# Patient Record
Sex: Female | Born: 1970 | Race: Black or African American | Hispanic: Yes | Smoking: Current every day smoker
Health system: Southern US, Community
[De-identification: ages and names within clinical notes are randomized; demographics above are authoritative.]

## PROBLEM LIST (undated history)

## (undated) DIAGNOSIS — C801 Malignant (primary) neoplasm, unspecified: Secondary | ICD-10-CM

## (undated) DIAGNOSIS — G629 Polyneuropathy, unspecified: Secondary | ICD-10-CM

## (undated) HISTORY — PX: CHOLECYSTECTOMY: SHX55

## (undated) HISTORY — PX: ABDOMINAL HYSTERECTOMY: SHX81

## (undated) HISTORY — PX: OTHER SURGICAL HISTORY: SHX169

## (undated) HISTORY — PX: BRAIN SURGERY: SHX531

---

## 2006-03-22 ENCOUNTER — Emergency Department: Payer: Self-pay | Admitting: Emergency Medicine

## 2007-04-18 ENCOUNTER — Ambulatory Visit: Payer: Self-pay

## 2007-05-09 ENCOUNTER — Encounter: Payer: Self-pay | Admitting: Orthopaedic Surgery

## 2007-05-16 ENCOUNTER — Encounter: Payer: Self-pay | Admitting: Orthopaedic Surgery

## 2007-06-16 ENCOUNTER — Encounter: Payer: Self-pay | Admitting: Orthopaedic Surgery

## 2007-07-20 ENCOUNTER — Ambulatory Visit: Payer: Self-pay | Admitting: Emergency Medicine

## 2007-12-18 ENCOUNTER — Ambulatory Visit: Payer: Self-pay | Admitting: Internal Medicine

## 2008-08-17 ENCOUNTER — Ambulatory Visit: Payer: Self-pay | Admitting: Internal Medicine

## 2008-12-20 ENCOUNTER — Encounter: Payer: Self-pay | Admitting: Orthopedic Surgery

## 2009-01-13 ENCOUNTER — Encounter: Payer: Self-pay | Admitting: Orthopedic Surgery

## 2009-02-13 ENCOUNTER — Encounter: Payer: Self-pay | Admitting: Orthopedic Surgery

## 2010-01-17 ENCOUNTER — Ambulatory Visit: Payer: Self-pay | Admitting: Internal Medicine

## 2010-04-21 ENCOUNTER — Ambulatory Visit: Payer: Self-pay | Admitting: Internal Medicine

## 2011-02-27 ENCOUNTER — Ambulatory Visit: Payer: Self-pay | Admitting: Family Medicine

## 2013-05-16 ENCOUNTER — Ambulatory Visit: Payer: Self-pay | Admitting: Specialist

## 2013-05-31 ENCOUNTER — Ambulatory Visit: Payer: Self-pay | Admitting: Specialist

## 2013-06-21 LAB — PATHOLOGY REPORT

## 2014-01-02 ENCOUNTER — Emergency Department: Payer: Self-pay | Admitting: Emergency Medicine

## 2014-01-02 LAB — COMPREHENSIVE METABOLIC PANEL
AST: 26 U/L (ref 15–37)
Albumin: 3.2 g/dL — ABNORMAL LOW (ref 3.4–5.0)
Alkaline Phosphatase: 104 U/L
Anion Gap: 5 — ABNORMAL LOW (ref 7–16)
BILIRUBIN TOTAL: 0.4 mg/dL (ref 0.2–1.0)
BUN: 10 mg/dL (ref 7–18)
CHLORIDE: 102 mmol/L (ref 98–107)
CO2: 28 mmol/L (ref 21–32)
Calcium, Total: 8.8 mg/dL (ref 8.5–10.1)
Creatinine: 0.91 mg/dL (ref 0.60–1.30)
EGFR (African American): >60
EGFR (Non-African Amer.): >60
GLUCOSE: 86 mg/dL (ref 65–99)
OSMOLALITY: 268 (ref 275–301)
Potassium: 3.9 mmol/L (ref 3.5–5.1)
SGPT (ALT): 27 U/L (ref 12–78)
Sodium: 135 mmol/L — ABNORMAL LOW (ref 136–145)
Total Protein: 7.2 g/dL (ref 6.4–8.2)

## 2014-01-02 LAB — CBC
HCT: 37.1 % (ref 35.0–47.0)
HGB: 12.6 g/dL (ref 12.0–16.0)
MCH: 34 pg (ref 26.0–34.0)
MCHC: 34 g/dL (ref 32.0–36.0)
MCV: 100 fL (ref 80–100)
Platelet: 96 10*3/uL — ABNORMAL LOW (ref 150–440)
RBC: 3.71 10*6/uL — ABNORMAL LOW (ref 3.80–5.20)
RDW: 14.5 % (ref 11.5–14.5)
WBC: 4.4 10*3/uL (ref 3.6–11.0)

## 2014-01-02 LAB — MAGNESIUM: Magnesium: 1.9 mg/dL

## 2015-04-17 DIAGNOSIS — C7800 Secondary malignant neoplasm of unspecified lung: Secondary | ICD-10-CM | POA: Insufficient documentation

## 2016-03-24 ENCOUNTER — Other Ambulatory Visit: Payer: Self-pay | Admitting: Family Medicine

## 2016-03-24 DIAGNOSIS — R29898 Other symptoms and signs involving the musculoskeletal system: Secondary | ICD-10-CM

## 2016-04-07 ENCOUNTER — Ambulatory Visit: Payer: Medicare Other

## 2016-04-08 ENCOUNTER — Ambulatory Visit: Payer: Medicare Other

## 2016-10-06 ENCOUNTER — Other Ambulatory Visit: Payer: Self-pay | Admitting: Orthopedic Surgery

## 2016-10-06 DIAGNOSIS — M25559 Pain in unspecified hip: Principal | ICD-10-CM

## 2016-10-06 DIAGNOSIS — G8929 Other chronic pain: Secondary | ICD-10-CM

## 2016-10-06 DIAGNOSIS — C3490 Malignant neoplasm of unspecified part of unspecified bronchus or lung: Secondary | ICD-10-CM

## 2016-10-14 ENCOUNTER — Encounter: Payer: Self-pay | Admitting: Radiology

## 2016-10-14 ENCOUNTER — Ambulatory Visit
Admission: RE | Admit: 2016-10-14 | Discharge: 2016-10-14 | Disposition: A | Discharge status: Home / Self Care | Payer: Medicare Other | Source: Ambulatory Visit | Attending: Orthopedic Surgery | Admitting: Orthopedic Surgery

## 2016-10-14 DIAGNOSIS — M5137 Other intervertebral disc degeneration, lumbosacral region: Secondary | ICD-10-CM | POA: Diagnosis not present

## 2016-10-14 DIAGNOSIS — G8929 Other chronic pain: Secondary | ICD-10-CM

## 2016-10-14 DIAGNOSIS — M25559 Pain in unspecified hip: Secondary | ICD-10-CM | POA: Diagnosis present

## 2016-10-14 DIAGNOSIS — C3490 Malignant neoplasm of unspecified part of unspecified bronchus or lung: Secondary | ICD-10-CM

## 2017-01-08 ENCOUNTER — Emergency Department
Admission: EM | Admit: 2017-01-08 | Discharge: 2017-01-09 | Disposition: A | Discharge status: Home / Self Care | Payer: Medicare Other | Attending: Emergency Medicine | Admitting: Emergency Medicine

## 2017-01-08 ENCOUNTER — Encounter: Payer: Self-pay | Admitting: Emergency Medicine

## 2017-01-08 DIAGNOSIS — F17290 Nicotine dependence, other tobacco product, uncomplicated: Secondary | ICD-10-CM | POA: Diagnosis not present

## 2017-01-08 DIAGNOSIS — G6289 Other specified polyneuropathies: Secondary | ICD-10-CM | POA: Insufficient documentation

## 2017-01-08 DIAGNOSIS — G5773 Causalgia of bilateral lower limbs: Secondary | ICD-10-CM | POA: Diagnosis present

## 2017-01-08 DIAGNOSIS — R609 Edema, unspecified: Secondary | ICD-10-CM | POA: Insufficient documentation

## 2017-01-08 DIAGNOSIS — Z95828 Presence of other vascular implants and grafts: Secondary | ICD-10-CM | POA: Insufficient documentation

## 2017-01-08 DIAGNOSIS — Z79891 Long term (current) use of opiate analgesic: Secondary | ICD-10-CM | POA: Insufficient documentation

## 2017-01-08 DIAGNOSIS — Z79899 Other long term (current) drug therapy: Secondary | ICD-10-CM | POA: Diagnosis not present

## 2017-01-08 HISTORY — DX: Malignant (primary) neoplasm, unspecified: C80.1

## 2017-01-08 HISTORY — DX: Polyneuropathy, unspecified: G62.9

## 2017-01-08 MED ORDER — OXYCODONE HCL 5 MG PO TABS
5.0000 mg | ORAL_TABLET | Freq: Once | ORAL | Status: AC
  Administered 2017-01-09: 5 mg via ORAL
  Filled 2017-01-08: qty 1

## 2017-01-08 NOTE — ED Triage Notes (Signed)
"  I can't manage my pain in my feet and lower legs".  Patient states she went to [facility] one day last week and hasn't been able to get pain under control since.    Patient takes Oxycodone 5mg  TID and Flexeril TID.  Today patient has taken oxycodone 10 mg at 1400, Flexeril 10 mg at 1400, and at 1800 patient took a 15 mg oral morphine extended release tablet (from her mother).

## 2017-01-08 NOTE — ED Notes (Signed)
Patient has a Right Chest port-a-cath. Does not want blood to be drawn peripherally.  Bloods not drawn in triage.

## 2017-01-09 DIAGNOSIS — G6289 Other specified polyneuropathies: Secondary | ICD-10-CM | POA: Diagnosis not present

## 2017-01-09 MED ORDER — HYDROMORPHONE HCL 1 MG/ML IJ SOLN
1.0000 mg | Freq: Once | INTRAMUSCULAR | Status: AC
  Administered 2017-01-09: 1 mg via INTRAMUSCULAR
  Filled 2017-01-09: qty 1

## 2017-01-09 NOTE — ED Provider Notes (Signed)
[facility] Emergency Department Provider Note  ____________________________________________   First MD Initiated Contact with Patient 01/08/17 2359     (approximate)  I have reviewed the triage vital signs and the nursing notes.   HISTORY  Chief Complaint Leg Pain   HPI Bonnie Adams is a 46 y.o. 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 The patient says that with changes of weather that she usually has exacerbations of her pain. She thinks that the increased humidity this week is setting off her symptoms. 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 She usually takes oxycodone at home which she took tonight. Also takes Lyrica and took somebody else's morphine but says that only made her little bit sleepy. She is still having 7 out of 10 pain. Does not report any shortness of breath or chest pain.Says that she also feels a spasm-like pain to her bilateral thighs intermittently. Past Medical History:  Diagnosis Date  . Cancer ([place])    head and neck cancer with mets to lungs  . Neuropathy     There are no active problems to display for this patient.   Past Surgical History:  Procedure Laterality Date  . ABDOMINAL HYSTERECTOMY    . BRAIN SURGERY     breast biopsy  . CHOLECYSTECTOMY    . lobectomy Right    right lower lobe lung    Prior to Admission medications   Medication Sig Start Date End Date Taking? Authorizing Provider  cyclobenzaprine (FLEXERIL) 10 MG tablet Take 10 mg by mouth 3 (three) times daily. 10/26/16  Yes [provider]  oxyCODONE (OXY IR/ROXICODONE) 5 MG immediate release tablet Take 5 mg by mouth 3 (three) times daily.   Yes [provider]     Allergies Patient has no known allergies.  No family history on file.  Social History Social History  Substance Use Topics  . Smoking status: Current Every Day Smoker    Types: E-cigarettes  . Smokeless tobacco: Never Used  . Alcohol use No    Review of Systems  Constitutional: No fever/chills Eyes: No visual changes. ENT: No sore throat. Cardiovascular: Denies chest pain. Respiratory: Denies shortness of breath. Gastrointestinal: No abdominal pain.  No nausea, no vomiting.  No diarrhea.  No constipation. Genitourinary: Negative for dysuria. Musculoskeletal: as above Skin: Negative for rash. Neurological: Negative for headaches, focal weakness or numbness.   ____________________________________________   PHYSICAL EXAM:  VITAL SIGNS: ED Triage Vitals [01/08/17 2002]  Enc Vitals Group     BP      Pulse      Resp      Temp      Temp src      SpO2      Weight 220 lb (99.8 kg)     Height 5\' 10"  (1.778 m)     Head Circumference      Peak Flow      Pain Score 9     Pain Loc      Pain Edu?      Excl. in [place]?     Constitutional: Alert and oriented. Well appearing and in no acute distress. Eyes: Conjunctivae are normal.  Head: Atraumatic. Nose: No congestion/rhinnorhea. Mouth/Throat: Mucous membranes are moist.  Neck:  No stridor.   Cardiovascular: Normal rate, regular rhythm. Grossly normal heart sounds.  Good peripheral circulation With equal bilateral radial as well as dorsalis pedis pulses. Respiratory: Normal respiratory effort.  No retractions. Lungs CTAB. Gastrointestinal: Soft and nontender. No distention. No CVA tenderness. Musculoskeletal: Trace bilateral lower extremity as well as hand edema without any erythema or focal swelling.  No joint effusions. Sensation is intact to light touch with only minimal tenderness to palpation to the bilateral lower extremity. Neurologic:  Normal speech and language. No gross focal neurologic deficits are  appreciated. Skin:  Skin is warm, dry and intact. No rash noted. Psychiatric: Mood and affect are normal. Speech and behavior are normal.  ____________________________________________   LABS (all labs ordered are listed, but only abnormal results are displayed)  Labs Reviewed  CBC WITH DIFFERENTIAL/PLATELET  COMPREHENSIVE METABOLIC PANEL  PROTIME-INR  APTT   ____________________________________________  EKG   ____________________________________________  RADIOLOGY   ____________________________________________   PROCEDURES  Procedure(s) performed:   Procedures  Critical Care performed:   ____________________________________________   INITIAL IMPRESSION / ASSESSMENT AND PLAN / ED COURSE  Pertinent labs & imaging results that were available during my care of the patient were reviewed by me and considered in my medical decision making (see chart for details).  Patient's labs reviewed from this past June on the record with reassuring blood counts as well as electrolytes and kidney function. No focal swelling to leaving believe that the patient has a DVT. Patient gives the history that the symptoms are consistent with exacerbation of her chronic symptoms. We will treat with pain control. Patient is a difficult IV stick and we would otherwise have to access her port. I feel that the risks outweigh the benefits at this time. However, if her pain is not well controlled and we may need to access her port for further workup and pain control.    ----------------------------------------- 1:30 AM on 01/09/2017 -----------------------------------------  Patient says that her pain is now to a 3 out of 10 which is her baseline. She says that she still having intermittent spasms but this is likely related to the coldness of the emergency department. She says this is his normal trigger for her spasm. Patient with likely exacerbation of chronic pain. Patient appears calm and without  any distress. We discussed further medication the patient says that she thinks she will be fine with her at home medication at this time. Will be following up with her primary care doctor. We'll be discharged home.  ____________________________________________   FINAL CLINICAL IMPRESSION(S) / ED DIAGNOSES  Peripheral edema. Exacerbation of chronic pain. Neuropathy.    NEW MEDICATIONS STARTED DURING THIS VISIT:  New Prescriptions   No medications on file     Note:  This document was prepared using Dragon voice recognition software and may include unintentional dictation errors.     Orbie Pyo, MD 01/09/17 [phone]

## 2017-01-09 NOTE — ED Notes (Signed)
Pt. Going home with friend 

## 2017-01-09 NOTE — ED Notes (Signed)
Pt. 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  Pt. States last CA treatment was 2016.  Pt. States pain medication that she takes did not decrease pain today.  Pt. States she was given 15 mg morphine tablet today by family member and that did not relieve pain in lower extremities.  Pt. Has both lower extremities wrapped in ace wraps.

## 2017-05-03 ENCOUNTER — Encounter: Payer: Self-pay | Admitting: Emergency Medicine

## 2017-05-03 ENCOUNTER — Emergency Department
Admission: EM | Admit: 2017-05-03 | Discharge: 2017-05-03 | Disposition: A | Discharge status: Home / Self Care | Payer: Medicare Other | Attending: Emergency Medicine | Admitting: Emergency Medicine

## 2017-05-03 ENCOUNTER — Emergency Department: Payer: Medicare Other

## 2017-05-03 DIAGNOSIS — Z79899 Other long term (current) drug therapy: Secondary | ICD-10-CM | POA: Diagnosis not present

## 2017-05-03 DIAGNOSIS — F17210 Nicotine dependence, cigarettes, uncomplicated: Secondary | ICD-10-CM | POA: Diagnosis not present

## 2017-05-03 DIAGNOSIS — R079 Chest pain, unspecified: Secondary | ICD-10-CM | POA: Insufficient documentation

## 2017-05-03 DIAGNOSIS — R0600 Dyspnea, unspecified: Secondary | ICD-10-CM | POA: Diagnosis not present

## 2017-05-03 DIAGNOSIS — Z86711 Personal history of pulmonary embolism: Secondary | ICD-10-CM | POA: Insufficient documentation

## 2017-05-03 DIAGNOSIS — Z859 Personal history of malignant neoplasm, unspecified: Secondary | ICD-10-CM | POA: Insufficient documentation

## 2017-05-03 DIAGNOSIS — Z902 Acquired absence of lung [part of]: Secondary | ICD-10-CM | POA: Insufficient documentation

## 2017-05-03 LAB — CBC
HEMATOCRIT: 40.5 % (ref 35.0–47.0)
HEMOGLOBIN: 14.2 g/dL (ref 12.0–16.0)
MCH: 32.3 pg (ref 26.0–34.0)
MCHC: 35.1 g/dL (ref 32.0–36.0)
MCV: 91.8 fL (ref 80.0–100.0)
Platelets: 217 10*3/uL (ref 150–440)
RBC: 4.41 MIL/uL (ref 3.80–5.20)
RDW: 12.6 % (ref 11.5–14.5)
WBC: 5.4 10*3/uL (ref 3.6–11.0)

## 2017-05-03 LAB — BASIC METABOLIC PANEL
Anion gap: 9 (ref 5–15)
BUN: 10 mg/dL (ref 6–20)
CHLORIDE: 105 mmol/L (ref 101–111)
CO2: 24 mmol/L (ref 22–32)
Calcium: 9.9 mg/dL (ref 8.9–10.3)
Creatinine, Ser: 1.44 mg/dL — ABNORMAL HIGH (ref 0.44–1.00)
GFR calc Af Amer: 49 mL/min — ABNORMAL LOW (ref >60)
GFR, EST NON AFRICAN AMERICAN: 42 mL/min — AB (ref >60)
GLUCOSE: 120 mg/dL — AB (ref 65–99)
Potassium: 3.6 mmol/L (ref 3.5–5.1)
SODIUM: 138 mmol/L (ref 135–145)

## 2017-05-03 LAB — TROPONIN I: Troponin I: <0.03 ng/mL (ref <0.03)

## 2017-05-03 MED ORDER — IOPAMIDOL (ISOVUE-370) INJECTION 76%
75.0000 mL | Freq: Once | INTRAVENOUS | Status: AC | PRN
  Administered 2017-05-03: 75 mL via INTRAVENOUS
  Filled 2017-05-03: qty 75

## 2017-05-03 MED ORDER — HEPARIN SOD (PORK) LOCK FLUSH 100 UNIT/ML IV SOLN
500.0000 [IU] | Freq: Once | INTRAVENOUS | Status: AC
  Administered 2017-05-03: 500 [IU] via INTRAVENOUS
  Filled 2017-05-03 (×2): qty 5

## 2017-05-03 NOTE — ED Provider Notes (Signed)
[facility] Emergency Department Provider Note  Time seen: 5:14 PM  I have reviewed the triage vital signs and the nursing notes.   HISTORY  Chief Complaint Chest Pain    HPI Bonnie Adams is a 46 y.o. female with a complicated medical history including metastatic lung cancer currently on holistic treatments, neuropathy, pulmonary embolism on Eliquis, presents to the emergency department for chest discomfort.  According to the patient for the past 5 days she has been experiencing a tightness sensation in her chest that worsens with exertion.  Patient states mild shortness of breath with exertion as well.  Denies any pleuritic chest pain.  Denies any leg pain or increase in swelling.  Patient does state a history of a pulmonary embolism for which she currently takes Eliquis.     Past Medical History:  Diagnosis Date  . Cancer ([place])    head and neck cancer with mets to lungs  . Neuropathy     There are no active problems to display for this patient.   Past Surgical History:  Procedure Laterality Date  . ABDOMINAL HYSTERECTOMY    . BRAIN SURGERY     breast biopsy  . CHOLECYSTECTOMY    . lobectomy Right    right lower lobe lung    Prior to Admission medications   Medication Sig Start Date End Date Taking? Authorizing Provider  cyclobenzaprine (FLEXERIL) 10 MG tablet Take 10 mg by mouth 3 (three) times daily. 10/26/16   [provider]  oxyCODONE (OXY IR/ROXICODONE) 5 MG immediate release tablet Take 5 mg by mouth 3 (three) times daily.    [provider]    No Known Allergies  No family history on file.  Social History Social History   Tobacco Use  . Smoking status: Current Every Day Smoker    Types: E-cigarettes  . Smokeless tobacco: Never Used  Substance Use Topics  . Alcohol use: No  . Drug use: No    Review of Systems Constitutional: Negative for fever. Cardiovascular: Positive for chest tightness times 5 days,  constant worse with exertion. Respiratory: Mild shortness of breath, worse with exertion Gastrointestinal: Negative for abdominal pain Musculoskeletal: Negative for leg pain or increased swelling All other ROS negative  ____________________________________________   PHYSICAL EXAM:  VITAL SIGNS: ED Triage Vitals  Enc Vitals Group     BP 05/03/17 1547 (!) 118/95     Pulse Rate 05/03/17 1547 95     Resp 05/03/17 1547 16     Temp 05/03/17 1547 98.6 F (37 C)     Temp Source 05/03/17 1547 Oral     SpO2 05/03/17 1547 97 %     Weight 05/03/17 1543 250 lb (113.4 kg)     Height 05/03/17 1543 5\' 10"  (1.778 m)     Head Circumference --      Peak Flow --      Pain Score 05/03/17 1543 8     Pain Loc --      Pain Edu? --      Excl. in [place]? --    Constitutional: Alert and oriented. Well appearing and in no distress. Eyes: Normal exam ENT   Head: Normocephalic and atraumatic   Mouth/Throat: Mucous membranes are moist. Cardiovascular: Normal rate, regular rhythm. No murmur Respiratory: Normal respiratory effort without tachypnea nor retractions. Breath sounds are clear  Gastrointestinal: Soft and nontender. No distention. Musculoskeletal: Nontender with normal range of motion in all extremities.  No lower extremity tenderness, slight pedal edema  bilaterally. Neurologic:  Normal speech and language. No gross focal neurologic deficits Skin:  Skin is warm, dry and intact.  Psychiatric: Mood and affect are normal.   ____________________________________________    EKG  EKG reviewed and interpreted by myself shows normal sinus rhythm at 93 bpm, narrow QRS, normal axis, normal intervals, no concerning ST changes.  ____________________________________________    RADIOLOGY  Chest x-ray is negative for acute abnormality  ____________________________________________   INITIAL IMPRESSION / ASSESSMENT AND PLAN / ED COURSE  Pertinent labs & imaging results that were available  during my care of the patient were reviewed by me and considered in my medical decision making (see chart for details).  Patient presents the emergency department for chest discomfort/tightness for the past 5 days.  Differential would include ACS, angina, PE, pneumonia, chest mass.  Overall the patient appears extremely well on examination, no distress lying in bed comfortably.  Patient is very pleasant to speak to.  On physical examination patient has normal heart sounds, clear lung sounds nontender chest or abdomen.  The patient's labs are largely within normal limits including a negative troponin.  Chest x-ray is normal and EKG is reassuring.  However given the patient's dyspnea and chest discomfort with exertion along with history of PE we will obtain a CT angiography of the chest to further evaluate.  Patient agreeable with this plan of care.  I reviewed the patient's records including oncology records, patient appears to have metastatic non-small cell lung carcinoma.  Patient CT scan is negative for PE.  We will discharge with PCP follow-up.  Patient agreeable to plan. ____________________________________________   FINAL CLINICAL IMPRESSION(S) / ED DIAGNOSES  Chest pain Dyspnea    Harvest Dark, MD 05/03/17 1912

## 2017-05-03 NOTE — ED Triage Notes (Signed)
Pt in via POV with complaints of a constant chest tightness x a few days, worsening with exertion.  Pt report associated dizziness and shortness of breath.  Pt with stage four lung cancer.  NAD noted at this time.

## 2017-05-03 NOTE — Discharge Instructions (Signed)
You have been seen in the emergency department today for chest pain. Your workup has shown normal results. As we discussed please follow-up with your primary care physician in the next 1-2 days for recheck. Return to the emergency department for any further chest pain, trouble breathing, or any other symptom personally concerning to yourself. °

## 2017-05-03 NOTE — ED Notes (Signed)
Pt has stage 4 lung cancer, last chemo 2016

## 2017-05-03 NOTE — ED Notes (Signed)
CT notified that patient's power port had been accessed by this RN.

## 2017-10-15 DIAGNOSIS — G620 Drug-induced polyneuropathy: Secondary | ICD-10-CM | POA: Insufficient documentation

## 2018-02-14 ENCOUNTER — Emergency Department: Payer: Medicare Other

## 2018-02-14 ENCOUNTER — Emergency Department
Admission: EM | Admit: 2018-02-14 | Discharge: 2018-02-14 | Disposition: A | Discharge status: Home / Self Care | Payer: Medicare Other | Attending: Emergency Medicine | Admitting: Emergency Medicine

## 2018-02-14 ENCOUNTER — Other Ambulatory Visit: Payer: Self-pay

## 2018-02-14 DIAGNOSIS — R42 Dizziness and giddiness: Secondary | ICD-10-CM

## 2018-02-14 DIAGNOSIS — C760 Malignant neoplasm of head, face and neck: Secondary | ICD-10-CM | POA: Diagnosis not present

## 2018-02-14 DIAGNOSIS — M25552 Pain in left hip: Secondary | ICD-10-CM | POA: Diagnosis not present

## 2018-02-14 DIAGNOSIS — F17290 Nicotine dependence, other tobacco product, uncomplicated: Secondary | ICD-10-CM | POA: Insufficient documentation

## 2018-02-14 DIAGNOSIS — Z85118 Personal history of other malignant neoplasm of bronchus and lung: Secondary | ICD-10-CM | POA: Diagnosis not present

## 2018-02-14 DIAGNOSIS — M25551 Pain in right hip: Secondary | ICD-10-CM

## 2018-02-14 LAB — CBC
HEMATOCRIT: 40 % (ref 35.0–47.0)
HEMOGLOBIN: 13.8 g/dL (ref 12.0–16.0)
MCH: 31.7 pg (ref 26.0–34.0)
MCHC: 34.5 g/dL (ref 32.0–36.0)
MCV: 91.9 fL (ref 80.0–100.0)
Platelets: 199 10*3/uL (ref 150–440)
RBC: 4.35 MIL/uL (ref 3.80–5.20)
RDW: 13.2 % (ref 11.5–14.5)
WBC: 14.2 10*3/uL — ABNORMAL HIGH (ref 3.6–11.0)

## 2018-02-14 LAB — BASIC METABOLIC PANEL
ANION GAP: 8 (ref 5–15)
BUN: 15 mg/dL (ref 6–20)
CALCIUM: 8.9 mg/dL (ref 8.9–10.3)
CO2: 23 mmol/L (ref 22–32)
Chloride: 109 mmol/L (ref 98–111)
Creatinine, Ser: 1.13 mg/dL — ABNORMAL HIGH (ref 0.44–1.00)
GFR, EST NON AFRICAN AMERICAN: 56 mL/min — AB (ref >60)
GLUCOSE: 113 mg/dL — AB (ref 70–99)
POTASSIUM: 3.7 mmol/L (ref 3.5–5.1)
Sodium: 140 mmol/L (ref 135–145)

## 2018-02-14 MED ORDER — PROCHLORPERAZINE EDISYLATE 10 MG/2ML IJ SOLN
10.0000 mg | Freq: Once | INTRAMUSCULAR | Status: AC
  Administered 2018-02-14: 10 mg via INTRAVENOUS
  Filled 2018-02-14: qty 2

## 2018-02-14 MED ORDER — SODIUM CHLORIDE 0.9 % IV BOLUS
1000.0000 mL | Freq: Once | INTRAVENOUS | Status: AC
  Administered 2018-02-14: 1000 mL via INTRAVENOUS

## 2018-02-14 MED ORDER — HEPARIN SOD (PORK) LOCK FLUSH 100 UNIT/ML IV SOLN
INTRAVENOUS | Status: AC
  Filled 2018-02-14: qty 5

## 2018-02-14 MED ORDER — KETOROLAC TROMETHAMINE 30 MG/ML IJ SOLN
30.0000 mg | Freq: Once | INTRAMUSCULAR | Status: AC
  Administered 2018-02-14: 30 mg via INTRAVENOUS
  Filled 2018-02-14: qty 1

## 2018-02-14 NOTE — ED Triage Notes (Signed)
Bilateral intermittent hip pain X 2 years, spasms to bilateral hips at this time. Pt tearful. Pt also reporting dizziness. Pt has hx of CA, not receiving treatment at this time.

## 2018-02-14 NOTE — ED Notes (Signed)
Pt states that she would like her port accessed for bloodwork.

## 2018-02-14 NOTE — ED Provider Notes (Signed)
[facility] Emergency Department Provider Note  ____________________________________________   I have reviewed the triage vital signs and the nursing notes.   HISTORY  Chief Complaint Hip Pain and Dizziness   History limited by: Not Limited   HPI Bonnie Adams is a 47 y.o. female who presents to the emergency department today with multiple medical concerns.  Her primary concern is for hip pain.  She states that this is been going on for quite some time.  It does come and go.  The patient states she has discussed this with her oncologist who is scheduling to do a bone scan early next year.  She thinks that she might of aggravated when she was swimming yesterday.  Typically the right hip hurts more.  In addition she has complaints of dizziness.  She feels like when she stands moves her head or moves her eyes she feels like the room is spinning around her.  She denies any history of vertigo.  Lastly she is also complaining of possible migraine headache.  She states that she has felt it coming on.  In terms of her hip pain the patient states that she does take an opioid blocker so opioids would not help her. She states she talked to her oncologist today who recommended going to the ER for pain control.    Per medical record review patient has a history of head and neck cancer.  Past Medical History:  Diagnosis Date  . Cancer ([place])    head and neck cancer with mets to lungs  . Neuropathy     There are no active problems to display for this patient.   Past Surgical History:  Procedure Laterality Date  . ABDOMINAL HYSTERECTOMY    . BRAIN SURGERY     breast biopsy  . CHOLECYSTECTOMY    . lobectomy Right    right lower lobe lung    Prior to Admission medications   Medication Sig Start Date End Date Taking? Authorizing Provider  cyclobenzaprine (FLEXERIL) 10 MG tablet Take 10 mg by mouth 3 (three) times daily. 10/26/16   [provider]  oxyCODONE  (OXY IR/ROXICODONE) 5 MG immediate release tablet Take 5 mg by mouth 3 (three) times daily.    [provider]    Allergies Patient has no known allergies.  No family history on file.  Social History Social History   Tobacco Use  . Smoking status: Current Every Day Smoker    Types: E-cigarettes  . Smokeless tobacco: Never Used  Substance Use Topics  . Alcohol use: No  . Drug use: No    Review of Systems Constitutional: No fever/chills Eyes: No visual changes. ENT: No sore throat. Cardiovascular: Denies chest pain. Respiratory: Denies shortness of breath. Gastrointestinal: No abdominal pain.  No nausea, no vomiting.  No diarrhea.   Genitourinary: Negative for dysuria. Musculoskeletal: Positive for bilateral hip pain. Skin: Negative for rash. Neurological: Positive for dizziness. Positive for headache. ____________________________________________   PHYSICAL EXAM:  VITAL SIGNS: ED Triage Vitals  Enc Vitals Group     BP 02/14/18 1427 129/82     Pulse Rate 02/14/18 1427 (!) 112     Resp 02/14/18 1427 18     Temp 02/14/18 1427 99.7 F (37.6 C)     Temp Source 02/14/18 1427 Oral     SpO2 02/14/18 1427 98 %     Weight 02/14/18 1428 220 lb (99.8 kg)     Height 02/14/18 1428 5\' 10"  (1.778 m)  Head Circumference --      Peak Flow --      Pain Score 02/14/18 1428 7   Constitutional: Alert and oriented.  Eyes: Conjunctivae are normal.  ENT      Head: Normocephalic and atraumatic.      Nose: No congestion/rhinnorhea.      Mouth/Throat: Mucous membranes are moist.      Neck: No stridor. Hematological/Lymphatic/Immunilogical: No cervical lymphadenopathy. Cardiovascular: Normal rate, regular rhythm.  No murmurs, rubs, or gallops.  Respiratory: Normal respiratory effort without tachypnea nor retractions. Breath sounds are clear and equal bilaterally. No wheezes/rales/rhonchi. Gastrointestinal: Soft and non tender. No rebound. No guarding.  Genitourinary:  Deferred Musculoskeletal: Normal range of motion in all extremities. No lower extremity edema. Neurologic:  Normal speech and language. No gross focal neurologic deficits are appreciated.  Skin:  Skin is warm, dry and intact. No rash noted. Psychiatric: Mood and affect are normal. Speech and behavior are normal. Patient exhibits appropriate insight and judgment.  ____________________________________________    LABS (pertinent positives/negatives)  CBC wbc 14.2, hgb 13.8, plt 199 BMP na 140, k 3.7, glu 113, cr 1.13  ____________________________________________   EKG  I, Nance Pear, attending physician, personally viewed and interpreted this EKG  EKG Time: 1434 Rate: 112 Rhythm: sinus tachycardia Axis: normal Intervals: qtc 466 QRS: narrow ST changes: no st elevation Impression: abnormal ekg   ____________________________________________    RADIOLOGY  Right hip and pelvis No acute osseous abnormality  ____________________________________________   PROCEDURES  Procedures  ____________________________________________   INITIAL IMPRESSION / ASSESSMENT AND PLAN / ED COURSE  Pertinent labs & imaging results that were available during my care of the patient were reviewed by me and considered in my medical decision making (see chart for details).   Patient presented to the emergency department today because of some worsening of chronic hip pain.  Also complains of some dizziness and migraine.  Patient had x-rays performed which did not show any acute osseous injury.  At this point do not feel any further emergent work-up is necessary for chronic hip pain.  Patient has been discussing this with her primary care doctor.  Patient did feel better after IV fluids and medication.  Did discuss that I would not mind checking a urine given her dizziness.  She however felt comfortable deferring at this point states she will follow-up with her primary care  provider.  ____________________________________________   FINAL CLINICAL IMPRESSION(S) / ED DIAGNOSES  Final diagnoses:  Dizziness  Pain of both hip joints     Note: This dictation was prepared with Dragon dictation. Any transcriptional errors that result from this process are unintentional     Nance Pear, MD 02/14/18 1815

## 2018-02-14 NOTE — Discharge Instructions (Addendum)
Please seek medical attention for any high fevers, chest pain, shortness of breath, change in behavior, persistent vomiting, bloody stool or any other new or concerning symptoms.  

## 2018-02-14 NOTE — ED Notes (Addendum)
Per verbal from EDP; This RN accessed pt power port in the upper right chest with a 20g. Pt had blood return labs sent at this time. BIO disc placed. RN informed Arsenio Loader RN.

## 2018-03-25 DIAGNOSIS — Z90710 Acquired absence of both cervix and uterus: Secondary | ICD-10-CM

## 2018-03-25 DIAGNOSIS — Z86711 Personal history of pulmonary embolism: Secondary | ICD-10-CM

## 2018-03-25 DIAGNOSIS — R197 Diarrhea, unspecified: Secondary | ICD-10-CM | POA: Diagnosis not present

## 2018-03-25 DIAGNOSIS — G8929 Other chronic pain: Secondary | ICD-10-CM | POA: Diagnosis present

## 2018-03-25 DIAGNOSIS — F329 Major depressive disorder, single episode, unspecified: Secondary | ICD-10-CM | POA: Diagnosis present

## 2018-03-25 DIAGNOSIS — A0471 Enterocolitis due to Clostridium difficile, recurrent: Secondary | ICD-10-CM | POA: Diagnosis present

## 2018-03-25 DIAGNOSIS — A419 Sepsis, unspecified organism: Secondary | ICD-10-CM | POA: Diagnosis not present

## 2018-03-25 DIAGNOSIS — E876 Hypokalemia: Secondary | ICD-10-CM | POA: Diagnosis present

## 2018-03-25 DIAGNOSIS — Z7901 Long term (current) use of anticoagulants: Secondary | ICD-10-CM

## 2018-03-25 DIAGNOSIS — Z85118 Personal history of other malignant neoplasm of bronchus and lung: Secondary | ICD-10-CM

## 2018-03-25 DIAGNOSIS — Z79899 Other long term (current) drug therapy: Secondary | ICD-10-CM

## 2018-03-25 DIAGNOSIS — F17290 Nicotine dependence, other tobacco product, uncomplicated: Secondary | ICD-10-CM | POA: Diagnosis present

## 2018-03-25 DIAGNOSIS — G629 Polyneuropathy, unspecified: Secondary | ICD-10-CM | POA: Diagnosis present

## 2018-03-25 DIAGNOSIS — E860 Dehydration: Secondary | ICD-10-CM | POA: Diagnosis present

## 2018-03-25 DIAGNOSIS — Z8589 Personal history of malignant neoplasm of other organs and systems: Secondary | ICD-10-CM

## 2018-03-26 ENCOUNTER — Inpatient Hospital Stay
Admission: EM | Admit: 2018-03-26 | Discharge: 2018-03-29 | DRG: 872 | Disposition: A | Discharge status: Home / Self Care | Payer: Medicare Other | Attending: Internal Medicine | Admitting: Internal Medicine

## 2018-03-26 ENCOUNTER — Other Ambulatory Visit: Payer: Self-pay

## 2018-03-26 ENCOUNTER — Encounter: Payer: Self-pay | Admitting: Emergency Medicine

## 2018-03-26 ENCOUNTER — Emergency Department: Payer: Medicare Other

## 2018-03-26 DIAGNOSIS — E860 Dehydration: Secondary | ICD-10-CM

## 2018-03-26 DIAGNOSIS — A0471 Enterocolitis due to Clostridium difficile, recurrent: Secondary | ICD-10-CM | POA: Diagnosis present

## 2018-03-26 DIAGNOSIS — R1084 Generalized abdominal pain: Secondary | ICD-10-CM

## 2018-03-26 DIAGNOSIS — R197 Diarrhea, unspecified: Secondary | ICD-10-CM

## 2018-03-26 DIAGNOSIS — A0472 Enterocolitis due to Clostridium difficile, not specified as recurrent: Secondary | ICD-10-CM | POA: Diagnosis present

## 2018-03-26 DIAGNOSIS — E876 Hypokalemia: Secondary | ICD-10-CM

## 2018-03-26 DIAGNOSIS — R112 Nausea with vomiting, unspecified: Secondary | ICD-10-CM

## 2018-03-26 LAB — COMPREHENSIVE METABOLIC PANEL
ALK PHOS: 114 U/L (ref 38–126)
ALT: 13 U/L (ref 0–44)
ANION GAP: 9 (ref 5–15)
AST: 16 U/L (ref 15–41)
Albumin: 3.8 g/dL (ref 3.5–5.0)
BUN: 10 mg/dL (ref 6–20)
CO2: 25 mmol/L (ref 22–32)
Calcium: 8.8 mg/dL — ABNORMAL LOW (ref 8.9–10.3)
Chloride: 103 mmol/L (ref 98–111)
Creatinine, Ser: 0.99 mg/dL (ref 0.44–1.00)
Glucose, Bld: 117 mg/dL — ABNORMAL HIGH (ref 70–99)
Potassium: 3 mmol/L — ABNORMAL LOW (ref 3.5–5.1)
SODIUM: 137 mmol/L (ref 135–145)
Total Bilirubin: 0.6 mg/dL (ref 0.3–1.2)
Total Protein: 7.1 g/dL (ref 6.5–8.1)

## 2018-03-26 LAB — CBC WITH DIFFERENTIAL/PLATELET
Abs Immature Granulocytes: 0.05 10*3/uL (ref 0.00–0.07)
BASOS ABS: 0 10*3/uL (ref 0.0–0.1)
Basophils Relative: 0 %
EOS ABS: 0.5 10*3/uL (ref 0.0–0.5)
EOS PCT: 4 %
HEMATOCRIT: 37.3 % (ref 36.0–46.0)
Hemoglobin: 12.8 g/dL (ref 12.0–15.0)
Immature Granulocytes: 0 %
Lymphocytes Relative: 12 %
Lymphs Abs: 1.5 10*3/uL (ref 0.7–4.0)
MCH: 31.1 pg (ref 26.0–34.0)
MCHC: 34.3 g/dL (ref 30.0–36.0)
MCV: 90.5 fL (ref 80.0–100.0)
MONO ABS: 1 10*3/uL (ref 0.1–1.0)
Monocytes Relative: 8 %
NRBC: 0 % (ref 0.0–0.2)
Neutro Abs: 9.4 10*3/uL — ABNORMAL HIGH (ref 1.7–7.7)
Neutrophils Relative %: 76 %
Platelets: 211 10*3/uL (ref 150–400)
RBC: 4.12 MIL/uL (ref 3.87–5.11)
RDW: 12.4 % (ref 11.5–15.5)
WBC: 12.4 10*3/uL — AB (ref 4.0–10.5)

## 2018-03-26 LAB — C DIFFICILE QUICK SCREEN W PCR REFLEX
C DIFFICILE (CDIFF) INTERP: DETECTED
C DIFFICILE (CDIFF) TOXIN: POSITIVE — AB
C DIFFICLE (CDIFF) ANTIGEN: POSITIVE — AB

## 2018-03-26 LAB — GASTROINTESTINAL PANEL BY PCR, STOOL (REPLACES STOOL CULTURE)

## 2018-03-26 LAB — URINALYSIS, COMPLETE (UACMP) WITH MICROSCOPIC
Bacteria, UA: NONE SEEN
Bilirubin Urine: NEGATIVE
GLUCOSE, UA: NEGATIVE mg/dL
Hgb urine dipstick: NEGATIVE
Ketones, ur: NEGATIVE mg/dL
Leukocytes, UA: NEGATIVE
NITRITE: NEGATIVE
PROTEIN: NEGATIVE mg/dL
pH: 6 (ref 5.0–8.0)

## 2018-03-26 LAB — LACTIC ACID, PLASMA: Lactic Acid, Venous: 1.1 mmol/L (ref 0.5–1.9)

## 2018-03-26 LAB — LIPASE, BLOOD: LIPASE: 54 U/L — AB (ref 11–51)

## 2018-03-26 LAB — MAGNESIUM: MAGNESIUM: 2.1 mg/dL (ref 1.7–2.4)

## 2018-03-26 LAB — PHOSPHORUS: Phosphorus: 4.4 mg/dL (ref 2.5–4.6)

## 2018-03-26 LAB — GLUCOSE, CAPILLARY: Glucose-Capillary: 109 mg/dL — ABNORMAL HIGH (ref 70–99)

## 2018-03-26 MED ORDER — ACETAMINOPHEN 325 MG PO TABS
650.0000 mg | ORAL_TABLET | Freq: Four times a day (QID) | ORAL | Status: DC | PRN
  Administered 2018-03-26: 19:00:00 650 mg via ORAL
  Filled 2018-03-26: qty 2

## 2018-03-26 MED ORDER — CYCLOBENZAPRINE HCL 10 MG PO TABS: 10.0000 mg | ORAL_TABLET | Freq: Three times a day (TID) | ORAL | Status: DC | PRN

## 2018-03-26 MED ORDER — DULOXETINE HCL 30 MG PO CPEP
60.0000 mg | ORAL_CAPSULE | Freq: Every day | ORAL | Status: DC
  Administered 2018-03-26 – 2018-03-29 (×4): 60 mg via ORAL
  Filled 2018-03-26 (×4): qty 2

## 2018-03-26 MED ORDER — SENNOSIDES-DOCUSATE SODIUM 8.6-50 MG PO TABS: 1.0000 | ORAL_TABLET | Freq: Every evening | ORAL | Status: DC | PRN

## 2018-03-26 MED ORDER — POTASSIUM CHLORIDE CRYS ER 10 MEQ PO TBCR
10.0000 meq | EXTENDED_RELEASE_TABLET | Freq: Two times a day (BID) | ORAL | Status: DC
  Administered 2018-03-26 – 2018-03-29 (×6): 10 meq via ORAL
  Filled 2018-03-26 (×6): qty 1

## 2018-03-26 MED ORDER — BUPRENORPHINE HCL 2 MG SL SUBL
2.0000 mg | SUBLINGUAL_TABLET | Freq: Two times a day (BID) | SUBLINGUAL | Status: DC
  Administered 2018-03-26 – 2018-03-29 (×6): 2 mg via SUBLINGUAL
  Filled 2018-03-26 (×6): qty 1

## 2018-03-26 MED ORDER — SPIRONOLACTONE 25 MG PO TABS
25.0000 mg | ORAL_TABLET | Freq: Every day | ORAL | Status: DC
  Administered 2018-03-26 – 2018-03-29 (×4): 25 mg via ORAL
  Filled 2018-03-26 (×4): qty 1

## 2018-03-26 MED ORDER — APIXABAN 5 MG PO TABS
5.0000 mg | ORAL_TABLET | Freq: Two times a day (BID) | ORAL | Status: DC
  Administered 2018-03-26 – 2018-03-29 (×6): 5 mg via ORAL
  Filled 2018-03-26 (×6): qty 1

## 2018-03-26 MED ORDER — ONDANSETRON HCL 4 MG/2ML IJ SOLN
4.0000 mg | Freq: Once | INTRAMUSCULAR | Status: AC
  Administered 2018-03-26: 4 mg via INTRAVENOUS
  Filled 2018-03-26: qty 2

## 2018-03-26 MED ORDER — POTASSIUM CHLORIDE 10 MEQ/100ML IV SOLN
10.0000 meq | INTRAVENOUS | Status: DC
  Administered 2018-03-26 (×4): 10 meq via INTRAVENOUS
  Filled 2018-03-26 (×4): qty 100

## 2018-03-26 MED ORDER — PIPERACILLIN-TAZOBACTAM 3.375 G IVPB 30 MIN: 3.3750 g | Freq: Once | INTRAVENOUS | Status: DC

## 2018-03-26 MED ORDER — IOPAMIDOL (ISOVUE-300) INJECTION 61%
100.0000 mL | Freq: Once | INTRAVENOUS | Status: AC | PRN
  Administered 2018-03-26: 100 mL via INTRAVENOUS

## 2018-03-26 MED ORDER — OXYCODONE HCL 5 MG PO TABS: 5.0000 mg | ORAL_TABLET | ORAL | Status: DC | PRN

## 2018-03-26 MED ORDER — ONDANSETRON HCL 4 MG/2ML IJ SOLN: 4.0000 mg | Freq: Four times a day (QID) | INTRAMUSCULAR | Status: DC | PRN

## 2018-03-26 MED ORDER — ZOLPIDEM TARTRATE 5 MG PO TABS: 5.0000 mg | ORAL_TABLET | Freq: Every evening | ORAL | Status: DC | PRN

## 2018-03-26 MED ORDER — FUROSEMIDE 20 MG PO TABS: 20.0000 mg | ORAL_TABLET | Freq: Two times a day (BID) | ORAL | Status: DC

## 2018-03-26 MED ORDER — PILOCARPINE HCL 5 MG PO TABS
5.0000 mg | ORAL_TABLET | Freq: Three times a day (TID) | ORAL | Status: DC
  Administered 2018-03-26 – 2018-03-29 (×9): 5 mg via ORAL
  Filled 2018-03-26 (×14): qty 1

## 2018-03-26 MED ORDER — LACTATED RINGERS IV SOLN
INTRAVENOUS | Status: AC
  Administered 2018-03-26: 11:00:00 via INTRAVENOUS

## 2018-03-26 MED ORDER — IOPAMIDOL (ISOVUE-300) INJECTION 61%
30.0000 mL | Freq: Once | INTRAVENOUS | Status: AC | PRN
  Administered 2018-03-26: 30 mL via ORAL

## 2018-03-26 MED ORDER — POTASSIUM CHLORIDE 10 MEQ/100ML IV SOLN
10.0000 meq | Freq: Once | INTRAVENOUS | Status: AC
  Administered 2018-03-26: 10 meq via INTRAVENOUS
  Filled 2018-03-26: qty 100

## 2018-03-26 MED ORDER — BISACODYL 5 MG PO TBEC: 5.0000 mg | DELAYED_RELEASE_TABLET | Freq: Every day | ORAL | Status: DC | PRN

## 2018-03-26 MED ORDER — BUPRENORPHINE HCL 300 MCG BU FILM: 1.0000 | ORAL_FILM | Freq: Two times a day (BID) | BUCCAL | Status: DC

## 2018-03-26 MED ORDER — TOPIRAMATE 100 MG PO TABS
100.0000 mg | ORAL_TABLET | Freq: Every day | ORAL | Status: DC
  Administered 2018-03-26 – 2018-03-28 (×3): 100 mg via ORAL
  Filled 2018-03-26 (×4): qty 1

## 2018-03-26 MED ORDER — SODIUM CHLORIDE 0.9 % IV BOLUS
1000.0000 mL | Freq: Once | INTRAVENOUS | Status: AC
  Administered 2018-03-26: 1000 mL via INTRAVENOUS

## 2018-03-26 MED ORDER — ACETAMINOPHEN 650 MG RE SUPP: 650.0000 mg | Freq: Four times a day (QID) | RECTAL | Status: DC | PRN

## 2018-03-26 MED ORDER — ONDANSETRON HCL 4 MG PO TABS: 4.0000 mg | ORAL_TABLET | Freq: Four times a day (QID) | ORAL | Status: DC | PRN

## 2018-03-26 MED ORDER — FIDAXOMICIN 200 MG PO TABS
200.0000 mg | ORAL_TABLET | Freq: Two times a day (BID) | ORAL | Status: DC
  Administered 2018-03-26 – 2018-03-29 (×7): 200 mg via ORAL
  Filled 2018-03-26 (×9): qty 1

## 2018-03-26 NOTE — Care Management (Signed)
RNCM received consult for possible medication assistance with Dificid which would be the preferred treatment for the patient. We will contact [facility] and other pharmacies in the area to assess for affordability. There are no other RNCM needs. We will continue to follow.

## 2018-03-26 NOTE — Progress Notes (Signed)
Patient ID: Bonnie Adams, female   DOB: 1970-08-05, 47 y.o.   MRN: [id number]  ACP note  Dx: Recurrent C. difficile colitis with leukocytosis, hypokalemia, hypocalcemia, dehydration.  History of head and neck cancer with metastases to lungs, neuropathy.  Plan.  Patient is a full code. Spoke with pharmacist about and to treat the recurrent C. difficile colitis and he stated that he will get the Dificid for her.  Care manager consult to look into Dificid and cost.  Depending on cost may have to go back to vancomycin.  Asked the nursing staff to release her medications that were signed and held this morning.  Time spent on ACP discussion 25 minutes Dr. Loletha Grayer

## 2018-03-26 NOTE — ED Triage Notes (Signed)
Pt presents to ED excessive gas and diarrhea that started about 4 days ago. Pt states she was recently dx with c-diff in august and states she only improved for a week after she completed her antibiotics. Diarrhea quickly returned but has now worsened. Told by pcp to come to ED for hydration and possible admission. Pt currently being tx for stage 4 CA.

## 2018-03-26 NOTE — ED Notes (Signed)
Pt reports diarrhea for the last 5 days, reports recent (2 months) hx of C-diff, pt hx of chemo for head and neck CA that spread to lungs (unsure of type except that it's rare),  Last port access in June, family in waiting room coming to room  Pt reports diffuse pain throughout abdomen

## 2018-03-26 NOTE — ED Provider Notes (Signed)
[facility] Emergency Department Provider Note   ____________________________________________   First MD Initiated Contact with Patient 03/26/18 [phone]     (approximate)  I have reviewed the triage vital signs and the nursing notes.   HISTORY  Chief Complaint Abdominal Pain    HPI Bonnie Adams is a 47 y.o. female who presents to the ED from home with a chief complaint of abdominal pain, nausea and diarrhea.  Recently treated for C. difficile over the summer with 2 different antibiotics.  States she only improved for less than 1 week before her diarrhea returned.  Complains of a 4-day history of frequent diarrhea especially after eating, nausea, dry heaving.  Came at the instruction of her doctor for hydration and possible hospitalization.  Denies associated fever, chills, chest pain, shortness of breath.  Denies recent travel or trauma.   Past Medical History:  Diagnosis Date  . 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 Neuropathy     Patient Active Problem List   Diagnosis Date Noted  . 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 ABDOMINAL HYSTERECTOMY    . BRAIN SURGERY     breast biopsy  . CHOLECYSTECTOMY    . lobectomy Right    right lower lobe lung    Prior to Admission medications   Medication Sig Start Date End Date Taking? Authorizing Provider  apixaban (ELIQUIS) 5 MG TABS tablet Take 5 mg by mouth 2 (two) times daily.   Yes [provider]  Buprenorphine HCl 300 MCG FILM Place 1 Film inside cheek 2 (two) times daily. 03/30/18  Yes [provider]  cyclobenzaprine (FLEXERIL) 10 MG tablet Take 10 mg by mouth 3 (three) times daily as needed.  10/26/16  Yes [provider]  DULoxetine (CYMBALTA) 60 MG capsule Take 60 mg by mouth daily.   Yes [provider]  furosemide (LASIX) 20 MG tablet Take 1 tablet by  mouth 2 (two) times daily. 11/25/17 11/25/18 Yes [provider]  pilocarpine (SALAGEN) 5 MG tablet Take 1 tablet by mouth 3 (three) times daily. 03/31/17 03/31/18 Yes [provider]  spironolactone (ALDACTONE) 25 MG tablet Take 1 tablet by mouth daily. 11/25/17 11/25/18 Yes [provider]  topiramate (TOPAMAX) 100 MG tablet Take 1 tablet by mouth at bedtime. 11/25/17 11/25/18 Yes [provider]  zolpidem (AMBIEN) 10 MG tablet Take 5 mg by mouth at bedtime as needed. 03/10/18 03/11/19 Yes [provider]  oxyCODONE (OXY IR/ROXICODONE) 5 MG immediate release tablet Take 5 mg by mouth 3 (three) times daily.    [provider]  potassium chloride (KLOR-CON M10) 10 MEQ tablet TAKE 1 TABLET (10 MEQ TOTAL) BY MOUTH 2 (TWO) TIMES DAILY 03/15/18   [provider]    Allergies Adhesive [tape]  No family history on file.  Social History Social History   Tobacco Use  . Smoking status: Current Every Day Smoker    Types: E-cigarettes  . Smokeless tobacco: Never Used  Substance Use Topics  . Alcohol use: No  . Drug use: No    Review of Systems  Constitutional: No fever/chills Eyes: No visual changes. ENT: No sore throat. Cardiovascular: Denies chest pain. Respiratory: Denies shortness of breath. Gastrointestinal: Positive for abdominal pain and nausea, no vomiting.  Positive for diarrhea.  No constipation. Genitourinary: Negative for dysuria. Musculoskeletal: Negative for back pain. Skin: Negative for rash.  Neurological: Negative for headaches, focal weakness or numbness.   ____________________________________________   PHYSICAL EXAM:  VITAL SIGNS: ED Triage Vitals [03/26/18 0025]  Enc Vitals Group     BP 116/74     Pulse Rate (!) 54     Resp      Temp 98.4 F (36.9 C)     Temp Source Oral     SpO2 97 %     Weight 220 lb (99.8 kg)     Height 5\' 10"  (1.778 m)     Head Circumference      Peak Flow      Pain Score 4      Pain Loc      Pain Edu?      Excl. in [place]?     Constitutional: Alert and oriented.  Chronically ill appearing and in mild acute distress. Eyes: Conjunctivae are normal. PERRL. EOMI. Head: Atraumatic. Nose: No congestion/rhinnorhea. Mouth/Throat: Mucous membranes are mildly dry.  Oropharynx non-erythematous. Neck: No stridor.   Cardiovascular: Normal rate, regular rhythm. Grossly normal heart sounds.  Good peripheral circulation. Respiratory: Normal respiratory effort.  No retractions. Lungs CTAB. Gastrointestinal: Soft and mildly diffusely tender palpation without rebound or guarding. No distention. No abdominal bruits. No CVA tenderness. Musculoskeletal: No lower extremity tenderness nor edema.  No joint effusions. Neurologic:  Normal speech and language. No gross focal neurologic deficits are appreciated.  Skin:  Skin is warm, dry and intact. No rash noted. Psychiatric: Mood and affect are normal. Speech and behavior are normal.  ____________________________________________   LABS (all labs ordered are listed, but only abnormal results are displayed)  Labs Reviewed  C DIFFICILE QUICK SCREEN W PCR REFLEX - Abnormal; Notable for the following components:      Result Value   C Diff antigen POSITIVE (*)    C Diff toxin POSITIVE (*)    All other components within normal limits  CBC WITH DIFFERENTIAL/PLATELET - Abnormal; Notable for the following components:   WBC 12.4 (*)    Neutro Abs 9.4 (*)    All other components within normal limits  LIPASE, BLOOD - Abnormal; Notable for the following components:   Lipase 54 (*)    All other components within normal limits  COMPREHENSIVE METABOLIC PANEL - Abnormal; Notable for the following components:   Potassium 3.0 (*)    Glucose, Bld 117 (*)    Calcium 8.8 (*)    All other components within normal limits  GASTROINTESTINAL PANEL BY PCR, STOOL (REPLACES STOOL CULTURE)  CULTURE, BLOOD (ROUTINE X 2)  CULTURE, BLOOD (ROUTINE X 2)    URINE CULTURE  URINALYSIS, COMPLETE (UACMP) WITH MICROSCOPIC  LACTIC ACID, PLASMA  LACTIC ACID, PLASMA   ____________________________________________  EKG  None ____________________________________________  RADIOLOGY  ED MD interpretation: Diffuse colonic wall thickening  Official radiology report(s): Ct Abdomen Pelvis W Contrast  Result Date: 03/26/2018 CLINICAL DATA:  Excessive gas and diarrhea starting 4 days ago. Recent diagnosis of C difficile. 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 EXAM: CT ABDOMEN AND PELVIS WITH CONTRAST TECHNIQUE: Multidetector CT imaging of the abdomen and pelvis was performed using the standard protocol following bolus administration of intravenous contrast. CONTRAST:  131mL ISOVUE-300 IOPAMIDOL (ISOVUE-300) INJECTION 61% COMPARISON:  CT chest 05/03/2017. MRI right hip 10/14/2016 FINDINGS: Lower chest: Atelectasis in the lung bases. Subcentimeter nodules demonstrated in both lung bases. Scarring in the right lung base. Hepatobiliary: No focal liver abnormality is seen. Status post cholecystectomy. No biliary dilatation.  Pancreas: Unremarkable. No pancreatic ductal dilatation or surrounding inflammatory changes. Spleen: Normal in size without focal abnormality. Adrenals/Urinary Tract: No adrenal gland nodules. Kidneys are symmetrical. No hydronephrosis or hydroureter. Cortical cyst on the left kidney. Bladder is unremarkable. Stomach/Bowel: Stomach, small bowel, and colon are not abnormally distended. There is mild colonic wall thickening and pericolonic stranding with fluid and air-fluid levels in the colon. This may be associated with colitis. Could represent infectious, inflammatory, or pseudomembranous colitis. Appendix is normal. Vascular/Lymphatic: No significant vascular findings are present. No enlarged abdominal or pelvic lymph nodes. Reproductive: Status post hysterectomy. No adnexal masses. Other: No free air or free  fluid in the abdomen. Abdominal wall musculature appears intact. Musculoskeletal: No acute or significant osseous findings. IMPRESSION: 1. Mild diffuse colonic wall thickening and pericolonic stranding with fluid and air-fluid levels in the colon. This may indicate infectious, inflammatory, or pseudomembranous colitis. 2. No evidence of metastatic disease in the abdomen or pelvis. Multiple subcentimeter nodules in the lungs consistent with known metastatic disease. Electronically Signed   By: Lucienne Capers M.D.   On: 03/26/2018 05:57    ____________________________________________   PROCEDURES  Procedure(s) performed: None  Procedures  Critical Care performed: Yes, see critical care note(s)   CRITICAL CARE Performed by: Paulette Blanch   Total critical care time: 30 minutes  Critical care time was exclusive of separately billable procedures and treating other patients.  Critical care was necessary to treat or prevent imminent or life-threatening deterioration.  Critical care was time spent personally by me on the following activities: development of treatment plan with patient and/or surrogate as well as nursing, discussions with consultants, evaluation of patient's response to treatment, examination of patient, obtaining history from patient or surrogate, ordering and performing treatments and interventions, ordering and review of laboratory studies, ordering and review of radiographic studies, pulse oximetry and re-evaluation of patient's condition.  ____________________________________________   INITIAL IMPRESSION / ASSESSMENT AND PLAN / ED COURSE  As part of my medical decision making, I reviewed the following data within the [place] History obtained from family, Nursing notes reviewed and incorporated, Labs reviewed, Old chart reviewed and Notes from prior ED visits   47 year old female with stage IV cancer, recent C. difficile status post 2 antibiotics who  presents with abdominal pain, nausea and frequent diarrhea. Differential diagnosis includes, but is not limited to, ovarian cyst, ovarian torsion, acute appendicitis, diverticulitis, urinary tract infection/pyelonephritis, endometriosis, bowel obstruction, colitis, renal colic, gastroenteritis, hernia, fibroids, endometriosis, etc.  Laboratory results remarkable for hypokalemia, mild leukocytosis, mild elevation in lipase.  Stool specimen is pending.  Will obtain urinalysis.  Initiate IV fluid resuscitation, potassium repletion, 4 mg IV Zofran for nausea.  Will proceed with CT abdomen/pelvis to evaluate for intra-abdominal etiology of patient's symptoms. Clinical Course as of Mar 26 614  Sat Mar 26, 2018  0614 Updated patient on CT result.  Will order for Fidaxomicin for recurrent C. difficile.  Discussed with hospitalist to evaluate patient in the emergency room for admission.   [JS]    Clinical Course User Index [JS] Paulette Blanch, MD     ____________________________________________   FINAL CLINICAL IMPRESSION(S) / ED DIAGNOSES  Final diagnoses:  Generalized abdominal pain  Nausea vomiting and diarrhea  C. difficile colitis  Hypokalemia  Dehydration     ED Discharge Orders    None       Note:  This document was prepared using Dragon voice recognition software and may include unintentional dictation errors.  Paulette Blanch, MD 03/26/18 [phone]

## 2018-03-26 NOTE — H&P (Signed)
[facility] [place] NAME: Bonnie Adams    MR#:  [id number]  DATE OF BIRTH:  December 12, 1970  DATE OF ADMISSION:  03/26/2018  PRIMARY CARE PHYSICIAN: Hortencia Pilar, MD   REQUESTING/REFERRING PHYSICIAN: Paulette Blanch, MD  CHIEF COMPLAINT:   Chief Complaint  Patient presents with  . Abdominal Pain    HISTORY OF PRESENT ILLNESS:  Bonnie Adams  is a 47 y.o. 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 difficile infxn (02/2018), now p/w diarrhea, recurrent C. difficile infxn. Pt states that for ~4d, she has been having multiple bouts of diarrhea, characterized as explosive, malodorous liquid brown stools, with excessive flatus and gas/distension. She denies AP, but endorses LLQ discomfort when defecating. She endorses small qty blood and pus/mucus in the stool, but denies gross large-volume BRBPR/hematochezia. She endorses chills, but denies fever, diaphoresis, night sweats and rigors. She endorses poor PO intake at baseline, which she attributes to her cancer Dx. She endorses nausea, but denies vomiting. She endorses dehydration. Denies urinary symptoms. She states she felt very weak when she arrived to the ED, but at the time of my assessment she is comfortable and well-appearing. She states she was able to ambulate to the toilet without difficulty. She meets criteria for SIRS (WBC > 12, tachypnea), with CT A/P (+) "Mild diffuse colonic wall thickening and pericolonic stranding with fluid and air-fluid levels in the colon. This may indicate infectious, inflammatory, or pseudomembranous colitis."; (+) sepsis, however pt does not appear toxic. She is comfortable, well-appearing and in no acute distress.  PAST MEDICAL HISTORY:   Past Medical History:  Diagnosis Date  . 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 Neuropathy     PAST  SURGICAL HISTORY:   Past Surgical History:  Procedure Laterality Date  . ABDOMINAL HYSTERECTOMY    . BRAIN SURGERY     breast biopsy  . CHOLECYSTECTOMY    . lobectomy Right    right lower lobe lung    SOCIAL HISTORY:   Social History   Tobacco Use  . Smoking status: Current Every Day Smoker    Types: E-cigarettes  . Smokeless tobacco: Never Used  Substance Use Topics  . Alcohol use: No    FAMILY HISTORY:  History reviewed. No pertinent family history.  DRUG ALLERGIES:   Allergies  Allergen Reactions  . Adhesive [Tape]     REVIEW OF SYSTEMS:   Review of Systems  Constitutional: Positive for chills and malaise/fatigue. Negative for diaphoresis, fever and weight loss.  HENT: Negative for congestion, ear pain, hearing loss, nosebleeds, sinus pain, sore throat and tinnitus.   Eyes: Negative for blurred vision, double vision and photophobia.  Respiratory: Negative for cough, hemoptysis, sputum production, shortness of breath and wheezing.   Cardiovascular: Negative for chest pain, palpitations, orthopnea, claudication, leg swelling and PND.  Gastrointestinal: Positive for abdominal pain, blood in stool, diarrhea and nausea. Negative for constipation, heartburn, melena and vomiting.  Genitourinary: Negative for dysuria, frequency, hematuria and urgency.  Musculoskeletal: Negative for back pain, falls, joint pain, myalgias and neck pain.  Skin: Negative for itching and rash.  Neurological: Positive for weakness. Negative for dizziness, tingling, tremors, sensory change, speech change, focal weakness, seizures, loss of consciousness and headaches.  Psychiatric/Behavioral: Negative for memory loss. The patient does not have insomnia.    MEDICATIONS AT  HOME:   Prior to Admission medications   Medication Sig Start Date End Date Taking? Authorizing Provider  apixaban (ELIQUIS) 5 MG TABS tablet Take 5 mg by mouth 2 (two) times daily.   Yes [provider]    Buprenorphine HCl 300 MCG FILM Place 1 Film inside cheek 2 (two) times daily. 03/30/18  Yes [provider]  cyclobenzaprine (FLEXERIL) 10 MG tablet Take 10 mg by mouth 3 (three) times daily as needed.  10/26/16  Yes [provider]  DULoxetine (CYMBALTA) 60 MG capsule Take 60 mg by mouth daily.   Yes [provider]  furosemide (LASIX) 20 MG tablet Take 1 tablet by mouth 2 (two) times daily. 11/25/17 11/25/18 Yes [provider]  pilocarpine (SALAGEN) 5 MG tablet Take 1 tablet by mouth 3 (three) times daily. 03/31/17 03/31/18 Yes [provider]  spironolactone (ALDACTONE) 25 MG tablet Take 1 tablet by mouth daily. 11/25/17 11/25/18 Yes [provider]  topiramate (TOPAMAX) 100 MG tablet Take 1 tablet by mouth at bedtime. 11/25/17 11/25/18 Yes [provider]  zolpidem (AMBIEN) 10 MG tablet Take 5 mg by mouth at bedtime as needed. 03/10/18 03/11/19 Yes [provider]  oxyCODONE (OXY IR/ROXICODONE) 5 MG immediate release tablet Take 5 mg by mouth 3 (three) times daily.    [provider]  potassium chloride (KLOR-CON M10) 10 MEQ tablet TAKE 1 TABLET (10 MEQ TOTAL) BY MOUTH 2 (TWO) TIMES DAILY 03/15/18   [provider]      VITAL SIGNS:  Blood pressure 112/71, pulse 90, temperature 99.3 F (37.4 C), temperature source Oral, resp. rate 16, height 5\' 10"  (1.778 m), weight 99.8 kg, SpO2 98 %.  PHYSICAL EXAMINATION:  Physical Exam  Constitutional: She is oriented to person, place, and time. She appears well-developed and well-nourished. She is active and cooperative.  Non-toxic appearance. She does not have a sickly appearance. She does not appear ill. No distress. She is not intubated.  HENT:  Head: Atraumatic.  Mouth/Throat: Oropharynx is clear and moist. No oropharyngeal exudate.  Eyes: Conjunctivae, EOM and lids are normal. No scleral icterus.  Neck: Neck supple. No JVD present. No thyromegaly present.   Cardiovascular: Normal rate, regular rhythm, S1 normal, S2 normal and normal heart sounds.  No extrasystoles are present. Exam reveals no gallop, no S3, no S4, no distant heart sounds and no friction rub.  No murmur heard. Pulmonary/Chest: Effort normal and breath sounds normal. No accessory muscle usage or stridor. No apnea, no tachypnea and no bradypnea. She is not intubated. No respiratory distress. She has no decreased breath sounds. She has no wheezes. She has no rhonchi. She has no rales.  Abdominal: Soft. Normal appearance and bowel sounds are normal. She exhibits no distension. There is tenderness in the left lower quadrant. There is no rigidity, no rebound and no guarding.  Lymphadenopathy:    She has no cervical adenopathy.  Neurological: She is alert and oriented to person, place, and time. She is not disoriented.  Skin: Skin is warm and dry. No rash noted. She is not diaphoretic. No erythema. No pallor.  Psychiatric: She has a normal mood and affect. Her speech is normal and behavior is normal. Judgment and thought content normal. Cognition and memory are normal.   LABORATORY PANEL:   CBC Recent Labs  Lab 03/26/18 0136  WBC 12.4*  HGB 12.8  HCT 37.3  PLT 211   ------------------------------------------------------------------------------------------------------------------  Chemistries  Recent Labs  Lab 03/26/18 0136 03/26/18 0622  NA 137  --   K 3.0*  --   CL 103  --   CO2 25  --   GLUCOSE 117*  --   BUN 10  --   CREATININE 0.99  --   CALCIUM 8.8*  --   MG  --  2.1  AST 16  --   ALT 13  --   ALKPHOS 114  --   BILITOT 0.6  --    ------------------------------------------------------------------------------------------------------------------  Cardiac Enzymes No results for input(s): TROPONINI in the last 168 hours. ------------------------------------------------------------------------------------------------------------------  RADIOLOGY:  Ct Abdomen  Pelvis W Contrast  Result Date: 03/26/2018 CLINICAL DATA:  Excessive gas and diarrhea starting 4 days ago. Recent diagnosis of C difficile. 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 EXAM: CT ABDOMEN AND PELVIS WITH CONTRAST TECHNIQUE: Multidetector CT imaging of the abdomen and pelvis was performed using the standard protocol following bolus administration of intravenous contrast. CONTRAST:  154mL ISOVUE-300 IOPAMIDOL (ISOVUE-300) INJECTION 61% COMPARISON:  CT chest 05/03/2017. MRI right hip 10/14/2016 FINDINGS: Lower chest: Atelectasis in the lung bases. Subcentimeter nodules demonstrated in both lung bases. Scarring in the right lung base. Hepatobiliary: No focal liver abnormality is seen. Status post cholecystectomy. No biliary dilatation. Pancreas: Unremarkable. No pancreatic ductal dilatation or surrounding inflammatory changes. Spleen: Normal in size without focal abnormality. Adrenals/Urinary Tract: No adrenal gland nodules. Kidneys are symmetrical. No hydronephrosis or hydroureter. Cortical cyst on the left kidney. Bladder is unremarkable. Stomach/Bowel: Stomach, small bowel, and colon are not abnormally distended. There is mild colonic wall thickening and pericolonic stranding with fluid and air-fluid levels in the colon. This may be associated with colitis. Could represent infectious, inflammatory, or pseudomembranous colitis. Appendix is normal. Vascular/Lymphatic: No significant vascular findings are present. No enlarged abdominal or pelvic lymph nodes. Reproductive: Status post hysterectomy. No adnexal masses. Other: No free air or free fluid in the abdomen. Abdominal wall musculature appears intact. Musculoskeletal: No acute or significant osseous findings. IMPRESSION: 1. Mild diffuse colonic wall thickening and pericolonic stranding with fluid and air-fluid levels in the colon. This may indicate infectious, inflammatory, or pseudomembranous colitis. 2. No  evidence of metastatic disease in the abdomen or pelvis. Multiple subcentimeter nodules in the lungs consistent with known metastatic disease. Electronically Signed   By: Lucienne Capers M.D.   On: 03/26/2018 05:57   IMPRESSION AND PLAN:   A/P: 4F p/w diarrhea, recurrent C. difficile colitis, SIRS (+), technically (+) sepsis. Hypokalemia, hyperglycemia, hypocalcemia, leukocytosis, dehydration. -Diarrhea, recurrent C. difficile colitis, SIRS/sepsis: WBC 12.4, tachypnea, SIRS (+). CT A/P (+) "Mild diffuse colonic wall thickening and pericolonic stranding with fluid and air-fluid levels in the colon. This may indicate infectious, inflammatory, or pseudomembranous colitis." C. Difficile Ag + Toxin (+). Fidaxomicin. IVF. Lactate 1.1. Cx pending. -Hypokalemia: Replete and monitor. Mag level WNL. -Hypocalcemia: Ionized calcium. -Dehydration: IVF. -c/w other home meds/formulary subs. -FEN/GI: Regular diet as tolerated. -DVT PPx: Eliqius. -Code status: Full code. -Disposition: Observation, < 2 midnights.   All the records are reviewed and case discussed with ED provider. Management plans discussed with the patient, family and they are in agreement.  CODE STATUS: Full code.  TOTAL TIME TAKING CARE OF THIS PATIENT: 75 minutes.    Arta Silence M.D on 03/26/2018 at 9:09 AM  Between 7am to 6pm - Pager - [phone]  After 6pm go to [email or web address] - Proofreader  [facility]  Office  [phone]  CC: Primary care physician; Hortencia Pilar, MD  Note: This dictation was prepared with Dragon dictation along with smaller phrase technology. Any transcriptional errors that result from this process are unintentional.

## 2018-03-26 NOTE — Progress Notes (Signed)
Patient's oral temperature 101.4, notified Dr. Brett Albino on call, received no new orders. Administered prn 650mg  tylenol. Night shift RN will follow up on patient. Will continue to monitor.

## 2018-03-26 NOTE — Plan of Care (Signed)

## 2018-03-26 NOTE — Progress Notes (Signed)
While assisting patient to the restroom, patient will stare straight forward appr. 2-3 seconds and then answer the question. Notified Dr. Leslye Peer, received no new orders, states the patient is a long term user of pain medication and may be a side effect. No acute distress, continuing to monitor.

## 2018-03-27 DIAGNOSIS — F329 Major depressive disorder, single episode, unspecified: Secondary | ICD-10-CM | POA: Diagnosis present

## 2018-03-27 DIAGNOSIS — F17290 Nicotine dependence, other tobacco product, uncomplicated: Secondary | ICD-10-CM | POA: Diagnosis present

## 2018-03-27 DIAGNOSIS — Z79899 Other long term (current) drug therapy: Secondary | ICD-10-CM | POA: Diagnosis not present

## 2018-03-27 DIAGNOSIS — R197 Diarrhea, unspecified: Secondary | ICD-10-CM | POA: Diagnosis present

## 2018-03-27 DIAGNOSIS — Z86711 Personal history of pulmonary embolism: Secondary | ICD-10-CM | POA: Diagnosis not present

## 2018-03-27 DIAGNOSIS — E876 Hypokalemia: Secondary | ICD-10-CM | POA: Diagnosis present

## 2018-03-27 DIAGNOSIS — Z90710 Acquired absence of both cervix and uterus: Secondary | ICD-10-CM | POA: Diagnosis not present

## 2018-03-27 DIAGNOSIS — G8929 Other chronic pain: Secondary | ICD-10-CM | POA: Diagnosis present

## 2018-03-27 DIAGNOSIS — E860 Dehydration: Secondary | ICD-10-CM | POA: Diagnosis present

## 2018-03-27 DIAGNOSIS — Z85118 Personal history of other malignant neoplasm of bronchus and lung: Secondary | ICD-10-CM | POA: Diagnosis not present

## 2018-03-27 DIAGNOSIS — Z7901 Long term (current) use of anticoagulants: Secondary | ICD-10-CM | POA: Diagnosis not present

## 2018-03-27 DIAGNOSIS — A419 Sepsis, unspecified organism: Secondary | ICD-10-CM | POA: Diagnosis present

## 2018-03-27 DIAGNOSIS — A0472 Enterocolitis due to Clostridium difficile, not specified as recurrent: Secondary | ICD-10-CM | POA: Diagnosis present

## 2018-03-27 DIAGNOSIS — Z8589 Personal history of malignant neoplasm of other organs and systems: Secondary | ICD-10-CM | POA: Diagnosis not present

## 2018-03-27 DIAGNOSIS — G629 Polyneuropathy, unspecified: Secondary | ICD-10-CM | POA: Diagnosis present

## 2018-03-27 DIAGNOSIS — A0471 Enterocolitis due to Clostridium difficile, recurrent: Secondary | ICD-10-CM | POA: Diagnosis present

## 2018-03-27 LAB — CBC
HCT: 35.3 % — ABNORMAL LOW (ref 36.0–46.0)
Hemoglobin: 12.3 g/dL (ref 12.0–15.0)
MCH: 31.3 pg (ref 26.0–34.0)
MCHC: 34.8 g/dL (ref 30.0–36.0)
MCV: 89.8 fL (ref 80.0–100.0)
PLATELETS: 202 10*3/uL (ref 150–400)
RBC: 3.93 MIL/uL (ref 3.87–5.11)
RDW: 12.4 % (ref 11.5–15.5)
WBC: 17.1 10*3/uL — ABNORMAL HIGH (ref 4.0–10.5)
nRBC: 0 % (ref 0.0–0.2)

## 2018-03-27 LAB — COMPREHENSIVE METABOLIC PANEL
ALBUMIN: 3.4 g/dL — AB (ref 3.5–5.0)
ALK PHOS: 107 U/L (ref 38–126)
ALT: 9 U/L (ref 0–44)
AST: 15 U/L (ref 15–41)
Anion gap: 10 (ref 5–15)
BILIRUBIN TOTAL: 0.6 mg/dL (ref 0.3–1.2)
BUN: 6 mg/dL (ref 6–20)
CALCIUM: 8.8 mg/dL — AB (ref 8.9–10.3)
CO2: 24 mmol/L (ref 22–32)
CREATININE: 1 mg/dL (ref 0.44–1.00)
Chloride: 102 mmol/L (ref 98–111)
GFR calc Af Amer: >60 mL/min (ref >60)
GFR calc non Af Amer: >60 mL/min (ref >60)
GLUCOSE: 106 mg/dL — AB (ref 70–99)
Potassium: 3.1 mmol/L — ABNORMAL LOW (ref 3.5–5.1)
SODIUM: 136 mmol/L (ref 135–145)
TOTAL PROTEIN: 6.9 g/dL (ref 6.5–8.1)

## 2018-03-27 LAB — URINE CULTURE: Culture: NO GROWTH

## 2018-03-27 MED ORDER — ENSURE ENLIVE PO LIQD
237.0000 mL | Freq: Two times a day (BID) | ORAL | Status: DC
  Administered 2018-03-27 – 2018-03-29 (×4): 237 mL via ORAL

## 2018-03-27 MED ORDER — ADULT MULTIVITAMIN W/MINERALS CH
1.0000 | ORAL_TABLET | Freq: Every day | ORAL | Status: DC
  Administered 2018-03-27 – 2018-03-29 (×3): 1 via ORAL
  Filled 2018-03-27 (×3): qty 1

## 2018-03-27 NOTE — Plan of Care (Signed)

## 2018-03-27 NOTE — Progress Notes (Signed)
Patient ID: Bonnie Adams, female   DOB: 07-Dec-1970, 47 y.o.   MRN: [id number]  [facility] PROGRESS NOTE  Kelse Ploch WPY:[id number] DOB: February 08, 1971 DOA: 03/26/2018 PCP: Hortencia Pilar, MD  HPI/Subjective: Patient feeling a little bit better today.  States she was out of it yesterday.  Had lots of diarrhea.  Diarrhea is still watery.  Some abdominal cramping.  Last night had a fever.  Poor appetite.  Not feeling great.  Objective: Vitals:   03/27/18 0403 03/27/18 1149  BP: (!) 104/57 122/82  Pulse: 91 73  Resp: 14 16  Temp: 98.6 F (37 C) 98.6 F (37 C)  SpO2: 98% 98%   No intake or output data in the 24 hours ending 03/27/18 1426 Filed Weights   03/26/18 0025  Weight: 99.8 kg    ROS: Review of Systems  Constitutional: Negative for chills and fever.  Eyes: Negative for blurred vision.  Respiratory: Negative for cough and shortness of breath.   Cardiovascular: Negative for chest pain.  Gastrointestinal: Positive for abdominal pain, diarrhea and nausea. Negative for constipation and vomiting.  Genitourinary: Negative for dysuria.  Musculoskeletal: Negative for joint pain.  Neurological: Negative for dizziness and headaches.   Exam: Physical Exam  Constitutional: She is oriented to person, place, and time.  HENT:  Nose: No mucosal edema.  Mouth/Throat: No oropharyngeal exudate or posterior oropharyngeal edema.  Eyes: Pupils are equal, round, and reactive to light. Conjunctivae, EOM and lids are normal.  Neck: No JVD present. Carotid bruit is not present. No edema present. No thyroid mass and no thyromegaly present.  Cardiovascular: S1 normal and S2 normal. Exam reveals no gallop.  No murmur heard. Pulses:      Dorsalis pedis pulses are 2+ on the right side, and 2+ on the left side.  Respiratory: No respiratory distress. She has no wheezes. She has no rhonchi. She has no rales.  GI: Soft. Bowel sounds are normal. There is generalized tenderness.   Musculoskeletal:       Right ankle: She exhibits no swelling.       Left ankle: She exhibits no swelling.  Lymphadenopathy:    She has no cervical adenopathy.  Neurological: She is alert and oriented to person, place, and time. No cranial nerve deficit.  Skin: Skin is warm. No rash noted. Nails show no clubbing.  Psychiatric: She has a normal mood and affect.      Data Reviewed: Basic Metabolic Panel: Recent Labs  Lab 03/26/18 0136 03/26/18 0622 03/27/18 0736  NA 137  --  136  K 3.0*  --  3.1*  CL 103  --  102  CO2 25  --  24  GLUCOSE 117*  --  106*  BUN 10  --  6  CREATININE 0.99  --  1.00  CALCIUM 8.8*  --  8.8*  MG  --  2.1  --   PHOS  --  4.4  --    Liver Function Tests: Recent Labs  Lab 03/26/18 0136 03/27/18 0736  AST 16 15  ALT 13 9  ALKPHOS 114 107  BILITOT 0.6 0.6  PROT 7.1 6.9  ALBUMIN 3.8 3.4*   Recent Labs  Lab 03/26/18 0136  LIPASE 54*   CBC: Recent Labs  Lab 03/26/18 0136 03/27/18 0736  WBC 12.4* 17.1*  NEUTROABS 9.4*  --   HGB 12.8 12.3  HCT 37.3 35.3*  MCV 90.5 89.8  PLT 211 202    CBG: Recent Labs  Lab 03/26/18 [place]  109*    Recent Results (from the past 240 hour(s))  Gastrointestinal Panel by PCR , Stool     Status: None   Collection Time: 03/26/18  1:51 AM  Result Value Ref Range Status   Campylobacter species NOT DETECTED NOT DETECTED Final   Plesimonas shigelloides NOT DETECTED NOT DETECTED Final   Salmonella species NOT DETECTED NOT DETECTED Final   Yersinia enterocolitica NOT DETECTED NOT DETECTED Final   Vibrio species NOT DETECTED NOT DETECTED Final   Vibrio cholerae NOT DETECTED NOT DETECTED Final   Enteroaggregative E coli (EAEC) NOT DETECTED NOT DETECTED Final   Enteropathogenic E coli (EPEC) NOT DETECTED NOT DETECTED Final   Enterotoxigenic E coli (ETEC) NOT DETECTED NOT DETECTED Final   Shiga like toxin producing E coli (STEC) NOT DETECTED NOT DETECTED Final   Shigella/Enteroinvasive E coli (EIEC)  NOT DETECTED NOT DETECTED Final   Cryptosporidium NOT DETECTED NOT DETECTED Final   Cyclospora cayetanensis NOT DETECTED NOT DETECTED Final   Entamoeba histolytica NOT DETECTED NOT DETECTED Final   Giardia lamblia NOT DETECTED NOT DETECTED Final   Adenovirus F40/41 NOT DETECTED NOT DETECTED Final   Astrovirus NOT DETECTED NOT DETECTED Final   Norovirus GI/GII NOT DETECTED NOT DETECTED Final   Rotavirus A NOT DETECTED NOT DETECTED Final   Sapovirus (I, II, IV, and V) NOT DETECTED NOT DETECTED Final    Comment: Performed at [facility], [address] [place], [place] [address]  C difficile quick scan w PCR reflex     Status: Abnormal   Collection Time: 03/26/18  1:51 AM  Result Value Ref Range Status   C Diff antigen POSITIVE (A) NEGATIVE Final   C Diff toxin POSITIVE (A) NEGATIVE Final   C Diff interpretation Toxin producing C. difficile detected.  Final    Comment: CRITICAL RESULT CALLED TO, READ BACK BY AND VERIFIED WITH: NOEL WEBSTER AT 0305 ON 03/26/18 [place]. Performed at [facility], [address] [place], [place] [address]   Culture, blood (routine x 2)     Status: None (Preliminary result)   Collection Time: 03/26/18  6:22 AM  Result Value Ref Range Status   Specimen Description BLOOD R AC  Final   Special Requests   Final    BOTTLES DRAWN AEROBIC AND ANAEROBIC Blood Culture results may not be optimal due to an excessive volume of blood received in culture bottles   Culture   Final    NO GROWTH 1 DAY Performed at [facility], [address] [place], [place] [address]    Report Status PENDING  Incomplete  Culture, blood (routine x 2)     Status: None (Preliminary result)   Collection Time: 03/26/18  6:22 AM  Result Value Ref Range Status   Specimen Description BLOOD R HAND  Final   Special Requests   Final    BOTTLES DRAWN AEROBIC AND ANAEROBIC Blood Culture adequate volume   Culture   Final    NO GROWTH 1 DAY Performed at [facility], [address] [place], [place] [address]    Report Status PENDING  Incomplete  Urine culture     Status: None   Collection Time: 03/26/18  6:22 AM  Result Value Ref Range Status   Specimen Description   Final    URINE, RANDOM Performed at [facility], [address] [place],  [address]    Special Requests   Final    NONE Performed at [facility] [place]  [address] [place], [place] [address]    Culture   Final    NO GROWTH Performed at [facility], [place] [address], [place], [place] [address]    Report Status 03/27/2018 FINAL  Final     Studies: Ct Abdomen Pelvis W Contrast  Result Date: 03/26/2018 CLINICAL DATA:  Excessive gas and diarrhea starting 4 days ago. Recent diagnosis of C difficile. 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 EXAM: CT ABDOMEN AND PELVIS WITH CONTRAST TECHNIQUE: Multidetector CT imaging of the abdomen and pelvis was performed using the standard protocol following bolus administration of intravenous contrast. CONTRAST:  125mL ISOVUE-300 IOPAMIDOL (ISOVUE-300) INJECTION 61% COMPARISON:  CT chest 05/03/2017. MRI right hip 10/14/2016 FINDINGS: Lower chest: Atelectasis in the lung bases. Subcentimeter nodules demonstrated in both lung bases. Scarring in the right lung base. Hepatobiliary: No focal liver abnormality is seen. Status post cholecystectomy. No biliary dilatation. Pancreas: Unremarkable. No pancreatic ductal dilatation or surrounding inflammatory changes. Spleen: Normal in size without focal abnormality. Adrenals/Urinary Tract: No adrenal gland nodules. Kidneys are symmetrical. No hydronephrosis or hydroureter. Cortical cyst on the left kidney. Bladder is unremarkable. Stomach/Bowel: Stomach, small bowel, and colon are not abnormally distended. There is mild colonic wall thickening and pericolonic stranding with fluid and air-fluid levels in the colon. This may be associated  with colitis. Could represent infectious, inflammatory, or pseudomembranous colitis. Appendix is normal. Vascular/Lymphatic: No significant vascular findings are present. No enlarged abdominal or pelvic lymph nodes. Reproductive: Status post hysterectomy. No adnexal masses. Other: No free air or free fluid in the abdomen. Abdominal wall musculature appears intact. Musculoskeletal: No acute or significant osseous findings. IMPRESSION: 1. Mild diffuse colonic wall thickening and pericolonic stranding with fluid and air-fluid levels in the colon. This may indicate infectious, inflammatory, or pseudomembranous colitis. 2. No evidence of metastatic disease in the abdomen or pelvis. Multiple subcentimeter nodules in the lungs consistent with known metastatic disease. Electronically Signed   By: Lucienne Capers M.D.   On: 03/26/2018 05:57    Scheduled Meds: . apixaban  5 mg Oral BID  . buprenorphine  2 mg Sublingual BID  . DULoxetine  60 mg Oral Daily  . feeding supplement (ENSURE ENLIVE)  237 mL Oral BID BM  . fidaxomicin  200 mg Oral BID  . multivitamin with minerals  1 tablet Oral Daily  . pilocarpine  5 mg Oral TID  . potassium chloride  10 mEq Oral BID  . spironolactone  25 mg Oral Daily  . topiramate  100 mg Oral QHS   Continuous Infusions:  Assessment/Plan:  1. Clinical sepsis with C. difficile colitis.  Patient was started on Dificid.  Spoke with care manager about trying to get this from specialty pharmacy.  Patient had a fever last night and leukocytosis. 2. History of pulmonary embolism on Eliquis 3. Chronic pain on buprenorphine 4. Hypokalemia replaced 5. History of headache neck cancer metastatic to lungs 6. Depression on Cymbalta  Code Status:     Code Status Orders  (From admission, onward)         Start     Ordered   03/26/18 0831  Full code  Continuous     03/26/18 0830        Code Status History    This patient has a current code status but no historical code  status.    Advance Directive Documentation     Most Recent Value  Type of Advance Directive  Living will, Healthcare Power of Attorney  Pre-existing out of facility DNR order (yellow form or pink MOST form)  -  "MOST" Form in Place?  -      Disposition Plan: Diarrhea will need to settle down prior to disposition.  Change to inpatient  Antibiotics:  Dificid  Time spent: 25 minutes  [place]

## 2018-03-27 NOTE — Care Management Obs Status (Signed)
[facility] NOTIFICATION   Patient Details  Name: Bonnie Adams MRN: [id number] Date of Birth: 1971-03-18   Medicare Observation Status Notification Given:  Yes    Clariza Sickman A Stelios Kirby, RN 03/27/2018, 8:10 AM

## 2018-03-27 NOTE — Progress Notes (Addendum)
Initial Nutrition Assessment  DOCUMENTATION CODES:   Obesity unspecified  INTERVENTION:   Ensure Enlive po BID, each supplement provides 350 kcal and 20 grams of protein  MVI daily  Patient may benefit from probiotics   NUTRITION DIAGNOSIS:   Increased nutrient needs related to cancer and cancer related treatments as evidenced by increased estimated needs.  GOAL:   Patient will meet greater than or equal to 90% of their needs  MONITOR:   PO intake, Supplement acceptance, Labs, Weight trends, Skin, I & O's  REASON FOR ASSESSMENT:   Malnutrition Screening Tool    ASSESSMENT:   47 y.o. 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 difficile infxn (02/2018), now p/w diarrhea, recurrent C. difficile infxn.    Unable to see patient today. Per chart, pt with 27lb(11%) weight loss over the past 5 months; this is significant given the time frame. RD will add supplements and MVI to help pt meet her estimated needs. RD will obtain nutrition related history and exam at follow up.    Medications reviewed and include: KCl, aldactone  Labs reviewed: K 3.1(L) P 4.4 wnl, Mg 2.1 wnl- 10/12 Wbc- 17.1(H)  Diet Order:   Diet Order            Diet regular Room service appropriate? Yes; Fluid consistency: Thin  Diet effective now             EDUCATION NEEDS:   No education needs have been identified at this time  Skin:  Skin Assessment: Reviewed RN Assessment  Last BM:  10/12- TYPE 7  Height:   Ht Readings from Last 1 Encounters:  03/26/18 5\' 10"  (1.778 m)    Weight:   Wt Readings from Last 1 Encounters:  03/26/18 99.8 kg    Ideal Body Weight:  68.2 kg  BMI:  Body mass index is 31.57 kg/m.  Estimated Nutritional Needs:   Kcal:  2000-2300kcal/day   Protein:  100-110g/day   Fluid:  >2L/day   Koleen Distance MS, RD, LDN Pager #- [phone] Office#-  [phone] After Hours Pager: [phone]

## 2018-03-27 NOTE — Progress Notes (Signed)
   03/27/18 2000  Clinical Encounter Type  Visited With Patient;Patient and family together (Husband)  Visit Type Initial;Spiritual support  Referral From Family  Recommendations Follow-up as requested.  Spiritual Encounters  Spiritual Needs Emotional   Chaplain met with the patient and her husband and began to discuss the patient's stress factors (i.e., health concerns, caregiving for a mother with dementia, and working a full-time job). During the conversation, Bonney Roussel was called away to report to a code.

## 2018-03-28 LAB — CALCIUM, IONIZED: CALCIUM, IONIZED, SERUM: 4.7 mg/dL (ref 4.5–5.6)

## 2018-03-28 MED ORDER — FIDAXOMICIN 200 MG PO TABS: 200.0000 mg | ORAL_TABLET | Freq: Two times a day (BID) | ORAL | 0 refills | Status: DC

## 2018-03-28 MED ORDER — POTASSIUM CHLORIDE CRYS ER 20 MEQ PO TBCR
40.0000 meq | EXTENDED_RELEASE_TABLET | Freq: Once | ORAL | Status: AC
  Administered 2018-03-28: 10:00:00 40 meq via ORAL
  Filled 2018-03-28: qty 2

## 2018-03-28 MED FILL — DIFICID 200 MG TABLET: 200 | 7 days supply | Qty: 14 | Fill #0

## 2018-03-28 NOTE — Progress Notes (Signed)
Patient ID: Bonnie Adams, female   DOB: 07-Oct-1970, 47 y.o.   MRN: [id number]  [facility] PROGRESS NOTE  Bonnie Adams IRS:[id number] DOB: 10-01-1970 DOA: 03/26/2018 PCP: Hortencia Pilar, MD  HPI/Subjective: Patient seen this morning and did not have any diarrhea this morning.  Did have some diarrhea yesterday for episodes.  Patient feeling better and starting to eat.  Objective: Vitals:   03/28/18 0915 03/28/18 1309  BP: 105/74 115/80  Pulse: 75 88  Resp:  20  Temp: 98.2 F (36.8 C) 98.3 F (36.8 C)  SpO2: 97% 98%   No intake or output data in the 24 hours ending 03/28/18 1424 Filed Weights   03/26/18 0025  Weight: 99.8 kg    ROS: Review of Systems  Constitutional: Negative for chills and fever.  Eyes: Negative for blurred vision.  Respiratory: Negative for cough and shortness of breath.   Cardiovascular: Negative for chest pain.  Gastrointestinal: Positive for diarrhea. Negative for abdominal pain, constipation, nausea and vomiting.  Genitourinary: Negative for dysuria.  Musculoskeletal: Negative for joint pain.  Neurological: Negative for dizziness and headaches.   Exam: Physical Exam  Constitutional: She is oriented to person, place, and time.  HENT:  Nose: No mucosal edema.  Mouth/Throat: No oropharyngeal exudate or posterior oropharyngeal edema.  Eyes: Pupils are equal, round, and reactive to light. Conjunctivae, EOM and lids are normal.  Neck: No JVD present. Carotid bruit is not present. No edema present. No thyroid mass and no thyromegaly present.  Cardiovascular: S1 normal and S2 normal. Exam reveals no gallop.  No murmur heard. Pulses:      Dorsalis pedis pulses are 2+ on the right side, and 2+ on the left side.  Respiratory: No respiratory distress. She has no wheezes. She has no rhonchi. She has no rales.  GI: Soft. Bowel sounds are normal. There is generalized tenderness.  Musculoskeletal:       Right ankle: She exhibits no swelling.       Left  ankle: She exhibits no swelling.  Lymphadenopathy:    She has no cervical adenopathy.  Neurological: She is alert and oriented to person, place, and time. No cranial nerve deficit.  Skin: Skin is warm. No rash noted. Nails show no clubbing.  Psychiatric: She has a normal mood and affect.      Data Reviewed: Basic Metabolic Panel: Recent Labs  Lab 03/26/18 0136 03/26/18 0622 03/27/18 0736  NA 137  --  136  K 3.0*  --  3.1*  CL 103  --  102  CO2 25  --  24  GLUCOSE 117*  --  106*  BUN 10  --  6  CREATININE 0.99  --  1.00  CALCIUM 8.8*  --  8.8*  MG  --  2.1  --   PHOS  --  4.4  --    Liver Function Tests: Recent Labs  Lab 03/26/18 0136 03/27/18 0736  AST 16 15  ALT 13 9  ALKPHOS 114 107  BILITOT 0.6 0.6  PROT 7.1 6.9  ALBUMIN 3.8 3.4*   Recent Labs  Lab 03/26/18 0136  LIPASE 54*   CBC: Recent Labs  Lab 03/26/18 0136 03/27/18 0736  WBC 12.4* 17.1*  NEUTROABS 9.4*  --   HGB 12.8 12.3  HCT 37.3 35.3*  MCV 90.5 89.8  PLT 211 202    CBG: Recent Labs  Lab 03/26/18 1554  GLUCAP 109*    Recent Results (from the past 240 hour(s))  Gastrointestinal Panel by PCR ,  Stool     Status: None   Collection Time: 03/26/18  1:51 AM  Result Value Ref Range Status   Campylobacter species NOT DETECTED NOT DETECTED Final   Plesimonas shigelloides NOT DETECTED NOT DETECTED Final   Salmonella species NOT DETECTED NOT DETECTED Final   Yersinia enterocolitica NOT DETECTED NOT DETECTED Final   Vibrio species NOT DETECTED NOT DETECTED Final   Vibrio cholerae NOT DETECTED NOT DETECTED Final   Enteroaggregative E coli (EAEC) NOT DETECTED NOT DETECTED Final   Enteropathogenic E coli (EPEC) NOT DETECTED NOT DETECTED Final   Enterotoxigenic E coli (ETEC) NOT DETECTED NOT DETECTED Final   Shiga like toxin producing E coli (STEC) NOT DETECTED NOT DETECTED Final   Shigella/Enteroinvasive E coli (EIEC) NOT DETECTED NOT DETECTED Final   Cryptosporidium NOT DETECTED NOT DETECTED  Final   Cyclospora cayetanensis NOT DETECTED NOT DETECTED Final   Entamoeba histolytica NOT DETECTED NOT DETECTED Final   Giardia lamblia NOT DETECTED NOT DETECTED Final   Adenovirus F40/41 NOT DETECTED NOT DETECTED Final   Astrovirus NOT DETECTED NOT DETECTED Final   Norovirus GI/GII NOT DETECTED NOT DETECTED Final   Rotavirus A NOT DETECTED NOT DETECTED Final   Sapovirus (I, II, IV, and V) NOT DETECTED NOT DETECTED Final    Comment: Performed at [facility], [address] [place], [place] [address]  C difficile quick scan w PCR reflex     Status: Abnormal   Collection Time: 03/26/18  1:51 AM  Result Value Ref Range Status   C Diff antigen POSITIVE (A) NEGATIVE Final   C Diff toxin POSITIVE (A) NEGATIVE Final   C Diff interpretation Toxin producing C. difficile detected.  Final    Comment: CRITICAL RESULT CALLED TO, READ BACK BY AND VERIFIED WITH: NOEL WEBSTER AT 0305 ON 03/26/18 [place]. Performed at [facility], [address] [place], [place] [address]   Culture, blood (routine x 2)     Status: None (Preliminary result)   Collection Time: 03/26/18  6:22 AM  Result Value Ref Range Status   Specimen Description BLOOD R AC  Final   Special Requests   Final    BOTTLES DRAWN AEROBIC AND ANAEROBIC Blood Culture results may not be optimal due to an excessive volume of blood received in culture bottles   Culture   Final    NO GROWTH 2 DAYS Performed at [facility], [address] [place], [place] [address]    Report Status PENDING  Incomplete  Culture, blood (routine x 2)     Status: None (Preliminary result)   Collection Time: 03/26/18  6:22 AM  Result Value Ref Range Status   Specimen Description BLOOD R HAND  Final   Special Requests   Final    BOTTLES DRAWN AEROBIC AND ANAEROBIC Blood Culture adequate volume   Culture   Final    NO GROWTH 2 DAYS Performed at [facility], [address] [place], [place] [address]    Report Status  PENDING  Incomplete  Urine culture     Status: None   Collection Time: 03/26/18  6:22 AM  Result Value Ref Range Status   Specimen Description   Final    URINE, RANDOM Performed at [facility], [address], [place], [place] [address]    Special Requests   Final    NONE Performed at [facility], [address], [place], [place] [address]    Culture   Final    NO  GROWTH Performed at [facility], [place] [address] [place], [place] [address]    Report Status 03/27/2018 FINAL  Final     Studies: No results found.  Scheduled Meds: . apixaban  5 mg Oral BID  . buprenorphine  2 mg Sublingual BID  . DULoxetine  60 mg Oral Daily  . feeding supplement (ENSURE ENLIVE)  237 mL Oral BID BM  . fidaxomicin  200 mg Oral BID  . multivitamin with minerals  1 tablet Oral Daily  . pilocarpine  5 mg Oral TID  . potassium chloride  10 mEq Oral BID  . spironolactone  25 mg Oral Daily  . topiramate  100 mg Oral QHS   Continuous Infusions:  Assessment/Plan:  1. Clinical sepsis with C. difficile colitis.  Patient was started on Dificid.  Temperature curve trended better.  White count still little high.  We are able to get this medication overnighted her for tomorrow.  Hopefully can discharge home tomorrow morning after morning dose of Dificid. 2. History of pulmonary embolism on Eliquis 3. Chronic pain on buprenorphine 4. Hypokalemia replaced 5. History of headache neck cancer metastatic to lungs 6. Depression on Cymbalta  Code Status:     Code Status Orders  (From admission, onward)         Start     Ordered   03/26/18 0831  Full code  Continuous     03/26/18 0830        Code Status History    This patient has a current code status but no historical code status.    Advance Directive Documentation     Most Recent Value  Type of Advance Directive  Living will, Healthcare Power of Attorney  Pre-existing out of facility DNR order (yellow form or pink  MOST form)  -  "MOST" Form in Place?  -      Disposition Plan: Hopefully will be able to discharge home tomorrow morning after morning dose of Dificid  Antibiotics:  Dificid  Time spent: 27 minutes  [place]

## 2018-03-28 NOTE — Care Management Note (Signed)
Case Management Note  Patient Details  Name: Bonnie Adams MRN: [id number] Date of Birth: [birth year]  Subjective/Objective:   Admitted to [facility] with the diagnosis of recurrent enterocolitis. Lives at home. Private insurance per [facility]. Dificid 200 mg oral 1 tablet by mouth 2 times a day ordered. No generic medications available.  Query through [employer]: Co-pay is $4,040. For 20 tablets.                    Action/Plan: Will fax prescription to [facility] [phone]).  This pharmacy will over night this drug to his home, if approved.  Will continue to follow for follow-up plans    Expected Discharge Date:                  Expected Discharge Plan:     In-House Referral:     Discharge planning Services     Post Acute Care Choice:    Choice offered to:     DME Arranged:    DME Agency:     HH Arranged:    HH Agency:     Status of Service:     If discussed at [employer], dates discussed:    Additional Comments:  Shelbie Ammons, RN MSN CCM Care Management [phone] 03/28/2018, 9:06 AM

## 2018-03-29 LAB — CBC
HCT: 33 % — ABNORMAL LOW (ref 36.0–46.0)
Hemoglobin: 11.4 g/dL — ABNORMAL LOW (ref 12.0–15.0)
MCH: 30.8 pg (ref 26.0–34.0)
MCHC: 34.5 g/dL (ref 30.0–36.0)
MCV: 89.2 fL (ref 80.0–100.0)
PLATELETS: 218 10*3/uL (ref 150–400)
RBC: 3.7 MIL/uL — AB (ref 3.87–5.11)
RDW: 12.5 % (ref 11.5–15.5)
WBC: 5.8 10*3/uL (ref 4.0–10.5)
nRBC: 0 % (ref 0.0–0.2)

## 2018-03-29 LAB — HIV ANTIBODY (ROUTINE TESTING W REFLEX): HIV SCREEN 4TH GENERATION: NONREACTIVE

## 2018-03-29 MED ORDER — RISAQUAD-2 PO CAPS: 1.0000 | ORAL_CAPSULE | Freq: Every morning | ORAL | 0 refills | Status: AC

## 2018-03-29 MED ORDER — HEPARIN SOD (PORK) LOCK FLUSH 100 UNIT/ML IV SOLN
500.0000 [IU] | Freq: Once | INTRAVENOUS | Status: AC
  Administered 2018-03-29: 500 [IU] via INTRAVENOUS
  Filled 2018-03-29: qty 5

## 2018-03-29 MED ORDER — ADULT MULTIVITAMIN W/MINERALS CH: 1.0000 | ORAL_TABLET | Freq: Every day | ORAL | 0 refills | Status: DC

## 2018-03-29 MED ORDER — ENSURE ENLIVE PO LIQD: 237.0000 mL | Freq: Two times a day (BID) | ORAL | 0 refills | Status: DC

## 2018-03-29 NOTE — Discharge Summary (Signed)
[facility] [place] NAME: Bonnie Adams    MR#:  [id number]  DATE OF BIRTH:  Jan 16, 1971  DATE OF ADMISSION:  03/26/2018 ADMITTING PHYSICIAN: Arta Silence, MD  DATE OF DISCHARGE: 03/29/2018 12:55 PM  PRIMARY CARE PHYSICIAN: Hortencia Pilar, MD    ADMISSION DIAGNOSIS:  Dehydration [E86.0] Hypokalemia [E87.6] Generalized abdominal pain [R10.84] C. 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 Neuropathy     HOSPITAL COURSE:   1.  Clinical sepsis with C. difficile colitis.  Patient was started on Dificid.  Temperature curve trended better.  White count normalized.  We were able to get the Dificid overnighted and will arrive today.  Patient will complete another week of Dificid.  If C. difficile recurs may consider stool transplant.  Patient's bowel movements had slowed down and actually did not have a bowel movement in a day and a half. 2.  History of pulmonary embolism on Eliquis 3.  History of chronic pain on buprenorphine and oxycodone 4.  Hypokalemia replaced 5.  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  Depression on Cymbalta  DISCHARGE CONDITIONS:  Satisfactory  CONSULTS OBTAINED:  Treatment Team:  Arta Silence, MD Loletha Grayer, MD  DRUG ALLERGIES:   Allergies  Allergen Reactions  . Adhesive [Tape]     DISCHARGE MEDICATIONS:   Allergies as of 03/29/2018      Reactions   Adhesive [tape]       Medication List    TAKE these medications   Buprenorphine HCl 300 MCG Film Place 1 Film inside cheek 2 (two) times daily. Start taking on:   03/30/2018   cyclobenzaprine 10 MG tablet Commonly known as:  FLEXERIL Take 10 mg by mouth 3 (three) times daily as needed.   DULoxetine 60 MG capsule Commonly known as:  CYMBALTA Take 60 mg by mouth daily.   ELIQUIS 5 MG Tabs tablet Generic drug:  apixaban Take 5 mg by mouth 2 (two) times daily.   feeding supplement (ENSURE ENLIVE) Liqd Take 237 mLs by mouth 2 (two) times daily between meals.   fidaxomicin 200 MG Tabs tablet Commonly known as:  DIFICID Take 1 tablet (200 mg total) by mouth 2 (two) times daily.   furosemide 20 MG tablet Commonly known as:  LASIX Take 1 tablet by mouth 2 (two) times daily.   KLOR-CON M10 10 MEQ tablet Generic drug:  potassium chloride TAKE 1 TABLET (10 MEQ TOTAL) BY MOUTH 2 (TWO) TIMES DAILY   multivitamin with minerals Tabs tablet Take 1 tablet by mouth daily.   oxyCODONE 5 MG immediate release tablet Commonly known as:  Oxy IR/ROXICODONE Take 5 mg by mouth 3 (three) times daily.   pilocarpine 5 MG tablet Commonly known as:  SALAGEN Take 1 tablet by mouth 3 (three) times daily.   RISAQUAD-2 Caps Take 1 capsule by mouth every morning.   spironolactone 25 MG tablet Commonly known as:  ALDACTONE Take 1 tablet by mouth daily.   topiramate 100 MG tablet Commonly known as:  TOPAMAX Take 1 tablet by mouth at bedtime.   zolpidem 10 MG tablet Commonly known as:  AMBIEN  Take 5 mg by mouth at bedtime as needed.        DISCHARGE INSTRUCTIONS:   Follow-up PMD 5 days  If you experience worsening of your admission symptoms, develop shortness of breath, life threatening emergency, suicidal or homicidal thoughts you must seek medical attention immediately by calling 911 or calling your MD immediately  if symptoms less severe.  You Must read complete instructions/literature along with all the possible adverse reactions/side effects for all the Medicines you take and that have been prescribed to you. Take any new Medicines after you have  completely understood and accept all the possible adverse reactions/side effects.   Please note  You were cared for by a hospitalist during your hospital stay. If you have any questions about your discharge medications or the care you received while you were in the hospital after you are discharged, you can call the unit and asked to speak with the hospitalist on call if the hospitalist that took care of you is not available. Once you are discharged, your primary care physician will handle any further medical issues. Please note that NO REFILLS for any discharge medications will be authorized once you are discharged, as it is imperative that you return to your primary care physician (or establish a relationship with a primary care physician if you do not have one) for your aftercare needs so that they can reassess your need for medications and monitor your lab values.    Today   CHIEF COMPLAINT:   Chief Complaint  Patient presents with  . Abdominal Pain    HISTORY OF PRESENT ILLNESS:  Bonnie Adams  is a 47 y.o. female presented with abdominal pain and diarrhea found to have C. difficile colitis   VITAL SIGNS:  Blood pressure 102/67, pulse 68, temperature 98.2 F (36.8 C), temperature source Oral, resp. rate 14, height 5\' 10"  (1.778 m), weight 99.8 kg, SpO2 98 %.   PHYSICAL EXAMINATION:  GENERAL:  47 y.o.-year-old patient lying in the bed with no acute distress.  EYES: Pupils equal, round, reactive to light and accommodation. No scleral icterus. Extraocular muscles intact.  HEENT: Head atraumatic, normocephalic. Oropharynx and nasopharynx clear.  NECK:  Supple, no jugular venous distention. No thyroid enlargement, no tenderness.  LUNGS: Normal breath sounds bilaterally, no wheezing, rales,rhonchi or crepitation. No use of accessory muscles of respiration.  CARDIOVASCULAR: S1, S2 normal. No murmurs, rubs, or gallops.  ABDOMEN: Soft, non-tender, non-distended. Bowel sounds present. No  organomegaly or mass.  EXTREMITIES: No pedal edema, cyanosis, or clubbing.  NEUROLOGIC: Cranial nerves II through XII are intact. Muscle strength 5/5 in all extremities. Sensation intact. Gait not checked.  PSYCHIATRIC: The patient is alert and oriented x 3.  SKIN: No obvious rash, lesion, or ulcer.   DATA REVIEW:   CBC Recent Labs  Lab 03/29/18 0803  WBC 5.8  HGB 11.4*  HCT 33.0*  PLT 218    Chemistries  Recent Labs  Lab 03/26/18 0622 03/27/18 0736  NA  --  136  K  --  3.1*  CL  --  102  CO2  --  24  GLUCOSE  --  106*  BUN  --  6  CREATININE  --  1.00  CALCIUM  --  8.8*  MG 2.1  --   AST  --  15  ALT  --  9  ALKPHOS  --  107  BILITOT  --  0.6     Microbiology Results  Results for orders placed or performed  during the hospital encounter of 03/26/18  Gastrointestinal Panel by PCR , Stool     Status: None   Collection Time: 03/26/18  1:51 AM  Result Value Ref Range Status   Campylobacter species NOT DETECTED NOT DETECTED Final   Plesimonas shigelloides NOT DETECTED NOT DETECTED Final   Salmonella species NOT DETECTED NOT DETECTED Final   Yersinia enterocolitica NOT DETECTED NOT DETECTED Final   Vibrio species NOT DETECTED NOT DETECTED Final   Vibrio cholerae NOT DETECTED NOT DETECTED Final   Enteroaggregative E coli (EAEC) NOT DETECTED NOT DETECTED Final   Enteropathogenic E coli (EPEC) NOT DETECTED NOT DETECTED Final   Enterotoxigenic E coli (ETEC) NOT DETECTED NOT DETECTED Final   Shiga like toxin producing E coli (STEC) NOT DETECTED NOT DETECTED Final   Shigella/Enteroinvasive E coli (EIEC) NOT DETECTED NOT DETECTED Final   Cryptosporidium NOT DETECTED NOT DETECTED Final   Cyclospora cayetanensis NOT DETECTED NOT DETECTED Final   Entamoeba histolytica NOT DETECTED NOT DETECTED Final   Giardia lamblia NOT DETECTED NOT DETECTED Final   Adenovirus F40/41 NOT DETECTED NOT DETECTED Final   Astrovirus NOT DETECTED NOT DETECTED Final   Norovirus GI/GII NOT  DETECTED NOT DETECTED Final   Rotavirus A NOT DETECTED NOT DETECTED Final   Sapovirus (I, II, IV, and V) NOT DETECTED NOT DETECTED Final    Comment: Performed at [facility], [address] [place], [place] [address]  C difficile quick scan w PCR reflex     Status: Abnormal   Collection Time: 03/26/18  1:51 AM  Result Value Ref Range Status   C Diff antigen POSITIVE (A) NEGATIVE Final   C Diff toxin POSITIVE (A) NEGATIVE Final   C Diff interpretation Toxin producing C. difficile detected.  Final    Comment: CRITICAL RESULT CALLED TO, READ BACK BY AND VERIFIED WITH: NOEL WEBSTER AT 0305 ON 03/26/18 [place]. Performed at [facility], [address] [place], [place] [address]   Culture, blood (routine x 2)     Status: None (Preliminary result)   Collection Time: 03/26/18  6:22 AM  Result Value Ref Range Status   Specimen Description BLOOD R AC  Final   Special Requests   Final    BOTTLES DRAWN AEROBIC AND ANAEROBIC Blood Culture results may not be optimal due to an excessive volume of blood received in culture bottles   Culture   Final    NO GROWTH 3 DAYS Performed at [facility], [address] [place], [place] [address]    Report Status PENDING  Incomplete  Culture, blood (routine x 2)     Status: None (Preliminary result)   Collection Time: 03/26/18  6:22 AM  Result Value Ref Range Status   Specimen Description BLOOD R HAND  Final   Special Requests   Final    BOTTLES DRAWN AEROBIC AND ANAEROBIC Blood Culture adequate volume   Culture   Final    NO GROWTH 3 DAYS Performed at [facility], [address] [place], [place] [address]    Report Status PENDING  Incomplete  Urine culture     Status: None   Collection Time: 03/26/18  6:22 AM  Result Value Ref Range Status   Specimen Description   Final    URINE, RANDOM Performed at [facility], [address] [place], [place] [address]    Special Requests   Final     NONE Performed at [facility], [address] [place], [place]  [address]    Culture   Final    NO GROWTH Performed at [facility], [place] [address] [place], [place] [address]    Report Status 03/27/2018 FINAL  Final      Management plans discussed with the patient, and she is in agreement.  CODE STATUS:     Code Status Orders  (From admission, onward)         Start     Ordered   03/26/18 0831  Full code  Continuous     03/26/18 0830        Code Status History    This patient has a current code status but no historical code status.    Advance Directive Documentation     Most Recent Value  Type of Advance Directive  Living will, Healthcare Power of Attorney  Pre-existing out of facility DNR order (yellow form or pink MOST form)  -  "MOST" Form in Place?  -      TOTAL TIME TAKING CARE OF THIS PATIENT: 35 minutes.    Loletha Grayer M.D on 03/29/2018 at 3:21 PM  Between 7am to 6pm - Pager - [phone]  After 6pm go to [email or web address] - password [email or web address]  [employer] Office  [phone]  CC: Primary care physician; Hortencia Pilar, MD

## 2018-03-31 LAB — CULTURE, BLOOD (ROUTINE X 2)
Culture: NO GROWTH
Culture: NO GROWTH
SPECIAL REQUESTS: ADEQUATE

## 2020-10-17 ENCOUNTER — Ambulatory Visit (INDEPENDENT_AMBULATORY_CARE_PROVIDER_SITE_OTHER): Payer: Medicare PPO | Admitting: Dermatology

## 2020-10-17 ENCOUNTER — Encounter: Payer: Self-pay | Admitting: Dermatology

## 2020-10-17 ENCOUNTER — Other Ambulatory Visit: Payer: Self-pay

## 2020-10-17 DIAGNOSIS — Z872 Personal history of diseases of the skin and subcutaneous tissue: Secondary | ICD-10-CM

## 2020-10-17 DIAGNOSIS — L578 Other skin changes due to chronic exposure to nonionizing radiation: Secondary | ICD-10-CM

## 2020-10-17 DIAGNOSIS — L259 Unspecified contact dermatitis, unspecified cause: Secondary | ICD-10-CM

## 2020-10-17 DIAGNOSIS — L821 Other seborrheic keratosis: Secondary | ICD-10-CM

## 2020-10-17 DIAGNOSIS — L814 Other melanin hyperpigmentation: Secondary | ICD-10-CM

## 2020-10-17 DIAGNOSIS — D1800 Hemangioma unspecified site: Secondary | ICD-10-CM

## 2020-10-17 DIAGNOSIS — D229 Melanocytic nevi, unspecified: Secondary | ICD-10-CM

## 2020-10-17 DIAGNOSIS — D2372 Other benign neoplasm of skin of left lower limb, including hip: Secondary | ICD-10-CM

## 2020-10-17 DIAGNOSIS — D2271 Melanocytic nevi of right lower limb, including hip: Secondary | ICD-10-CM

## 2020-10-17 DIAGNOSIS — D489 Neoplasm of uncertain behavior, unspecified: Secondary | ICD-10-CM | POA: Diagnosis not present

## 2020-10-17 DIAGNOSIS — Z1283 Encounter for screening for malignant neoplasm of skin: Secondary | ICD-10-CM

## 2020-10-17 MED ORDER — PIMECROLIMUS 1 % EX CREA: TOPICAL_CREAM | Freq: Two times a day (BID) | CUTANEOUS | 3 refills | Status: DC | PRN

## 2020-10-17 NOTE — Patient Instructions (Addendum)
Melanoma ABCDEs  Melanoma is the most dangerous type of skin cancer, and is the leading cause of death from skin disease.  You are more likely to develop melanoma if you:  Have light-colored skin, light-colored eyes, or red or blond hair  Spend a lot of time in the sun  Tan regularly, either outdoors or in a tanning bed  Have had blistering sunburns, especially during childhood  Have a close family member who has had a melanoma  Have atypical moles or large birthmarks  Early detection of melanoma is key since treatment is typically straightforward and cure rates are extremely high if we catch it early.   The first sign of melanoma is often a change in a mole or a new dark spot.  The ABCDE system is a way of remembering the signs of melanoma.  A for asymmetry:  The two halves do not match. B for border:  The edges of the growth are irregular. C for color:  A mixture of colors are present instead of an even brown color. D for diameter:  Melanomas are usually (but not always) greater than 87mm - the size of a pencil eraser. E for evolution:  The spot keeps changing in size, shape, and color.  Please check your skin once per month between visits. You can use a small mirror in front and a large mirror behind you to keep an eye on the back side or your body.   If you see any new or changing lesions before your next follow-up, please call to schedule a visit.  Please continue daily skin protection including broad spectrum sunscreen SPF 30+ to sun-exposed areas, reapplying every 2 hours as needed when you're outdoors.   Staying in the shade or wearing long sleeves, sun glasses (UVA+UVB protection) and wide brim hats (4-inch brim around the entire circumference of the hat) are also recommended for sun protection.    Recommend taking Heliocare sun protection supplement daily in sunny weather for additional sun protection. For maximum protection on the sunniest days, you can take up to 2  capsules of regular Heliocare OR take 1 capsule of Heliocare Ultra. For prolonged exposure (such as a full day in the sun), you can repeat your dose of the supplement 4 hours after your first dose. Heliocare can be purchased at [facility] or at [email or web address].

## 2020-10-17 NOTE — Addendum Note (Signed)
Addended by: Ruthell Rummage A on: 10/17/2020 03:31 PM   Modules accepted: Orders

## 2020-10-17 NOTE — Progress Notes (Signed)
New Patient Visit  Subjective  Bonnie Adams is a 50 y.o. female who presents for the following: New Patient (Initial Visit) (Patient has history of several tumors removed from multiple locations of body. She denies history of skin cancer. ).  Patient here for full body skin exam and skin cancer screening. Cyst - discussed removal at chest    Objective  Well appearing patient in no apparent distress; mood and affect are within normal limits.  A full examination was performed including scalp, head, eyes, ears, nose, lips, neck, chest, axillae, abdomen, back, buttocks, bilateral upper extremities, bilateral lower extremities, hands, feet, fingers, toes, fingernails, and toenails. All findings within normal limits unless otherwise noted below.  Objective  Left Proximal 4th Finger: Scaly pink plaque  Objective  Left medial plantar foot:  0.6 cm brown macule darker focus at one border        Objective  Right Lower Leg - Posterior: Brown thin papules  Images      Assessment & Plan  Contact dermatitis, unspecified contact dermatitis type, unspecified trigger Left Proximal 4th Finger  Patient states she develops rash when wearing wedding right  Irritant contact dermatitis due to water being trapped under ring vs allergic contact dermatitis to metal (nickel or other)  Discussed possible causes. Patch testing deferred today  Recommend pimecrolimus 1 % cream twice a day      Neoplasm of uncertain behavior Left medial plantar foot  Discussed biopsy or take photo and recheck in 6 weeks  Benign nevus vs atypia  Shave removal deferred today   Will photo and recheck in 6 weeks. She will call for any changes before then.    Multiple nevi Right Lower Leg - Posterior  Benign-appearing.  Observation.  Call clinic for new or changing lesions.  Recommend daily use of broad spectrum spf 30+ sunscreen to sun-exposed areas.    Lentigines - Scattered tan macules -  Due to sun exposure - Benign-appering, observe - Recommend daily broad spectrum sunscreen SPF 30+ to sun-exposed areas, reapply every 2 hours as needed. - Call for any changes  Seborrheic Keratoses - Stuck-on, waxy, tan-brown papules and/or plaques  - Benign-appearing - Discussed benign etiology and prognosis. - Observe - Call for any changes  Melanocytic Nevi - Tan-brown and/or pink-flesh-colored symmetric macules and papules right big toe, posterior left calf  - Benign appearing on exam today - Observation - Call clinic for new or changing moles - Recommend daily use of broad spectrum spf 30+ sunscreen to sun-exposed areas.   Dermatofibroma - Firm pink/brown papulenodule with dimple sign at right hip and back of left thigh - Benign appearing - Call for any changes  Hemangiomas - Red papules - Discussed benign nature - Observe - Call for any changes  Actinic Damage - Chronic condition, secondary to cumulative UV/sun exposure - diffuse scaly erythematous macules with underlying dyspigmentation - Recommend daily broad spectrum sunscreen SPF 30+ to sun-exposed areas, reapply every 2 hours as needed.  - Staying in the shade or wearing long sleeves, sun glasses (UVA+UVB protection) and wide brim hats (4-inch brim around the entire circumference of the hat) are also recommended for sun protection.  - Call for new or changing lesions.  History of multiple lesions/growths removed from skin - Unclear whether any were skin cancer or other - will request records - multiple scars at back and right thumb; no evidence of recurrence\ - no axillary, inguinal or cervical lymphadenopathy  Skin cancer screening performed today.  Return in  about 6 weeks (around 11/28/2020) for recheck nub at left foot .  I, Ruthell Rummage, CMA, am acting as scribe for Forest Gleason, MD.  Documentation: I have reviewed the above documentation for accuracy and completeness, and I agree with the  above.  Forest Gleason, MD

## 2020-10-22 ENCOUNTER — Telehealth: Payer: Self-pay

## 2020-10-22 ENCOUNTER — Other Ambulatory Visit: Payer: Self-pay

## 2020-10-22 DIAGNOSIS — L259 Unspecified contact dermatitis, unspecified cause: Secondary | ICD-10-CM

## 2020-10-22 MED ORDER — TRIAMCINOLONE ACETONIDE 0.1 % EX CREA: TOPICAL_CREAM | CUTANEOUS | 3 refills | Status: DC

## 2020-10-22 NOTE — Telephone Encounter (Signed)
Pimecrolimus denied by insurance. Pt must try and fail [facility] 0.025%, 0.1%, 0.5%, mometasone, or betamethasone dipropionate. Please advise.

## 2020-10-22 NOTE — Progress Notes (Signed)
New rx sent to pharmacy

## 2020-11-27 ENCOUNTER — Ambulatory Visit: Payer: Medicare PPO | Admitting: Dermatology

## 2020-12-19 ENCOUNTER — Other Ambulatory Visit: Payer: Self-pay

## 2020-12-19 ENCOUNTER — Emergency Department: Payer: Medicare PPO

## 2020-12-19 ENCOUNTER — Emergency Department
Admission: EM | Admit: 2020-12-19 | Discharge: 2020-12-19 | Disposition: A | Discharge status: Home / Self Care | Payer: Medicare PPO | Attending: Emergency Medicine | Admitting: Emergency Medicine

## 2020-12-19 ENCOUNTER — Encounter: Payer: Self-pay | Admitting: Emergency Medicine

## 2020-12-19 DIAGNOSIS — Z7901 Long term (current) use of anticoagulants: Secondary | ICD-10-CM | POA: Insufficient documentation

## 2020-12-19 DIAGNOSIS — S82831A Other fracture of upper and lower end of right fibula, initial encounter for closed fracture: Secondary | ICD-10-CM | POA: Insufficient documentation

## 2020-12-19 DIAGNOSIS — Z85828 Personal history of other malignant neoplasm of skin: Secondary | ICD-10-CM | POA: Diagnosis not present

## 2020-12-19 DIAGNOSIS — S99911A Unspecified injury of right ankle, initial encounter: Secondary | ICD-10-CM | POA: Diagnosis present

## 2020-12-19 DIAGNOSIS — F17290 Nicotine dependence, other tobacco product, uncomplicated: Secondary | ICD-10-CM | POA: Diagnosis not present

## 2020-12-19 DIAGNOSIS — Y9351 Activity, roller skating (inline) and skateboarding: Secondary | ICD-10-CM | POA: Insufficient documentation

## 2020-12-19 DIAGNOSIS — M25571 Pain in right ankle and joints of right foot: Secondary | ICD-10-CM | POA: Diagnosis not present

## 2020-12-19 MED ORDER — HYDROCODONE-ACETAMINOPHEN 5-325 MG PO TABS
1.0000 | ORAL_TABLET | ORAL | Status: AC
  Administered 2020-12-19: 1 via ORAL
  Filled 2020-12-19: qty 1

## 2020-12-19 MED ORDER — HYDROCODONE-ACETAMINOPHEN 5-325 MG PO TABS: 1.0000 | ORAL_TABLET | ORAL | 0 refills | Status: DC | PRN

## 2020-12-19 NOTE — ED Provider Notes (Signed)
[facility] EMERGENCY DEPARTMENT Provider Note   CSN: [id number] Arrival date & time: 12/19/20  1448     History Chief Complaint  Patient presents with   Ankle Pain    Bonnie Adams is a 50 y.o. female presents to the emergency department for evaluation of right ankle pain.  Earlier today she fell out of skating rink and injured her right ankle.  She felt a pop along the right lateral aspect of the ankle.  She denies any medial ankle pain.  She is unable to bear weight.  She has not any medications for pain.  She denies any other injury to her body.  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     No family history on file.  Social History   Tobacco Use   Smoking status: Every Day    Pack years: 0.00    Types: E-cigarettes   Smokeless tobacco: Never  Vaping Use   Vaping Use: Every day  Substance Use Topics   Alcohol use: No   Drug use: No    Home Medications Prior to Admission medications   Medication Sig Start Date End Date Taking? Authorizing Provider  HYDROcodone-acetaminophen (NORCO) 5-325 MG tablet Take 1 tablet by mouth every 4 (four) hours as needed for moderate pain. 12/19/20  Yes Duanne Guess, PA-C  apixaban (ELIQUIS) 5 MG TABS tablet Take 5 mg by mouth 2 (two) times daily.    [provider]  ascorbic acid (VITAMIN C) 1000 MG tablet Take 1,000 mg by mouth.    [provider]  clidinium-chlordiazePOXIDE (LIBRAX) 5-2.5 MG capsule Daily each  morning and up to one additional capsule if needed later in the day for cramping around bowel movements 10/14/20   [provider]  Coenzyme Q10 10 MG capsule Take by mouth.    [provider]  DULoxetine (CYMBALTA) 60 MG capsule Take 60 mg by mouth daily.    [provider]  ergocalciferol (VITAMIN D2) 1.25 MG (50000 UT) capsule Take by mouth. 07/16/20   [provider]  furosemide (LASIX) 20 MG tablet Take 1 tablet by mouth 2 (two) times daily. 11/25/17 10/17/20  [provider]  nortriptyline (PAMELOR) 50 MG capsule Take by mouth. 08/06/20 11/04/20  [provider]  pilocarpine (SALAGEN) 5 MG tablet Take by mouth. 01/22/20 01/21/21  [provider]  pimecrolimus (ELIDEL) 1 % cream Apply topically 2 (two) times daily as needed. For rash 10/17/20   Moye, [place], MD  Probiotic Product (RISAQUAD-2) CAPS Take 1 capsule by mouth every morning. 03/29/18   Loletha Grayer, MD  spironolactone (ALDACTONE) 25 MG tablet Take 1 tablet by mouth daily. 11/25/17 10/17/20  [provider]  SUMAtriptan (IMITREX) 100 MG tablet Take by mouth. 10/31/18   [provider]  topiramate (TOPAMAX) 100 MG tablet Take 1 tablet by mouth at bedtime. 11/25/17 10/17/20  [provider]  traZODone (DESYREL) 50 MG tablet Take by mouth. 08/14/20   [provider]  triamcinolone cream (KENALOG) 0.1 % Apply twice daily as needed to affected areas for two weeks then apply twice daily on weekends only. 10/22/20   Moye, [place], MD    Allergies    Adhesive [tape], Citrullus vulgaris, Nsaids, and Other  Review of Systems   Review of Systems  Constitutional:  Negative for fever.  Gastrointestinal:  Negative for nausea and vomiting.  Musculoskeletal:  Positive for arthralgias and gait problem. Negative for myalgias and neck pain.  Skin:  Negative for rash and wound.  Neurological:  Negative for numbness and headaches.   Physical Exam Updated Vital  Signs BP (!) 114/94 (BP Location: Right Arm)   Pulse (!) 46   Temp 98.5 F (36.9 C) (Oral)   Resp 18   Ht 5\' 9"  (1.753 m)   Wt 98.4 kg   SpO2 98%   BMI 32.05 kg/m   Physical Exam Constitutional:      Appearance: She is well-developed.  HENT:     Head: Normocephalic and atraumatic.  Eyes:     Conjunctiva/sclera: Conjunctivae normal.  Cardiovascular:     Rate and Rhythm: Normal rate.  Pulmonary:     Effort: Pulmonary effort is normal. No respiratory distress.  Musculoskeletal:     Cervical back: Normal range of motion.     Comments: Right lower extremity shows no skin breakdown noted.  Tender along the distal fibula with no medial malleoli or tenderness.  She is nontender along the tarsals and metatarsals.  She is able to maintain knee extension.  Skin:    General: Skin is warm.     Findings: No rash.  Neurological:     General: No focal deficit present.     Mental Status: She is alert and oriented to person, place, and time.  Psychiatric:        Behavior: Behavior normal.        Thought Content: Thought content normal.        Judgment: Judgment normal.    ED Results / Procedures / Treatments   Labs (all labs ordered are listed, but only abnormal results are displayed) Labs Reviewed - No data to display  EKG None  Radiology DG Ankle Complete Right  Result Date: 12/19/2020 CLINICAL DATA:  [place] skating. EXAM: RIGHT ANKLE - COMPLETE 3+ VIEW COMPARISON:  None. FINDINGS: There is an oblique coursing nondisplaced fracture of the distal fibular shaft above the level of the ankle mortise. The ankle mortise is maintained. There is also a small avulsion fracture involving the distal tip of the medial malleolus. The mid and hindfoot bony structures are intact. IMPRESSION: Nondisplaced distal fibular shaft fracture and small avulsion fracture involving the distal tip of the medial malleolus. Electronically Signed   By: Marijo Sanes M.D.   On: 12/19/2020 16:19     Procedures Procedures   Medications Ordered in ED Medications  HYDROcodone-acetaminophen (NORCO/VICODIN) 5-325 MG per tablet 1 tablet (has no administration in time range)    ED Course  I have reviewed the triage vital signs and the nursing notes.  Pertinent labs & imaging results that were available during my care of the patient were reviewed by me and considered in my medical decision making (see chart for details).    MDM Rules/Calculators/A&P  50 year old female with right ankle pain from a fall earlier today.  Patient x-rays showed a nondisplaced distal fibular shaft fracture.  She is placed into a posterior short leg splint, she is given crutches and will be nonweightbearing.  She is given Norco for pain.  She will rest ice and elevate the ankle and call orthopedic office to schedule follow-up appointment.  She understands signs symptoms return to the ER for. Final Clinical Impression(s) / ED Diagnoses Final diagnoses:  Other closed fracture of distal end of right fibula, initial encounter    Rx / DC Orders ED Discharge Orders          Ordered    HYDROcodone-acetaminophen (NORCO) 5-325 MG tablet  Every 4 hours PRN        12/19/20 1653             Renata Caprice 12/19/20 1655    Blake Divine, MD 12/24/20 1733

## 2020-12-19 NOTE — ED Triage Notes (Signed)
Pt was roller skating and fell. She states that she heard a pop in her right ankle. Ankle is swollen.

## 2020-12-19 NOTE — Discharge Instructions (Addendum)
Please rest ice and elevate the right lower extremity.  Use crutches as needed for ambulation.  Do not bear any weight on the right lower leg.  Take Norco as needed for pain.  Call orthopedic office tomorrow to schedule follow-up appointment.

## 2021-01-08 ENCOUNTER — Ambulatory Visit (INDEPENDENT_AMBULATORY_CARE_PROVIDER_SITE_OTHER): Payer: Medicare PPO | Admitting: Dermatology

## 2021-01-08 ENCOUNTER — Encounter: Payer: Self-pay | Admitting: Dermatology

## 2021-01-08 ENCOUNTER — Other Ambulatory Visit: Payer: Self-pay

## 2021-01-08 DIAGNOSIS — L821 Other seborrheic keratosis: Secondary | ICD-10-CM

## 2021-01-08 DIAGNOSIS — D229 Melanocytic nevi, unspecified: Secondary | ICD-10-CM

## 2021-01-08 DIAGNOSIS — D225 Melanocytic nevi of trunk: Secondary | ICD-10-CM | POA: Diagnosis not present

## 2021-01-08 DIAGNOSIS — D492 Neoplasm of unspecified behavior of bone, soft tissue, and skin: Secondary | ICD-10-CM | POA: Diagnosis not present

## 2021-01-08 NOTE — Progress Notes (Signed)
   Follow-Up Visit   Subjective  Bonnie Adams is a 50 y.o. female who presents for the following: Follow-up (F/u to recheck spot on left foot. Pt reports no changes.). She does not some other spots she would like checked.  The following portions of the chart were reviewed this encounter and updated as appropriate:  Tobacco  Allergies  Meds  Problems  Med Hx  Surg Hx  Fam Hx     Review of Systems: No other skin or systemic complaints except as noted in HPI or Assessment and Plan.    Objective  Well appearing patient in no apparent distress; mood and affect are within normal limits.  A focused examination was performed including left foot, thighs, suprapubic area. Relevant physical exam findings are noted in the Assessment and Plan.  Left suprapubic Left suprapubic 2.2 cm dark brown thin papule  Left medial plantar foot See photos         Assessment & Plan  Nevus Left suprapubic  Benign-appearing.  Observation.  Call clinic for new or changing moles.  Recommend daily use of broad spectrum spf 30+ sunscreen to sun-exposed areas.    Neoplasm of skin Left medial plantar foot  Benign nevus vs atypia  Pictures taken today and compared with pictures from last visit. No changes. Will recheck for any changes at f/u.   Seborrheic Keratoses - Stuck-on, waxy, tan-brown papules and/or plaques  - Benign-appearing - Discussed benign etiology and prognosis. - Observe - Call for any changes  Return in about 6 months (around 07/11/2021).  I, Harriett Sine, CMA, am acting as scribe for Forest Gleason, MD.  Documentation: I have reviewed the above documentation for accuracy and completeness, and I agree with the above.  Forest Gleason, MD

## 2021-05-01 ENCOUNTER — Telehealth: Payer: Self-pay

## 2021-05-01 NOTE — Telephone Encounter (Signed)
I actually recommend not using a steroid anymore due to thinning and lightening of the skin with long-term steroid use. The only thing that could be stronger and would not thin the skin is Op[place], which we can send in and see if her insurance will cover (for dermatitis). We may also have a sample or two we could give her. If this does not clear the problem (or if it recurs), I would recommend we consider patch testing for her to look for an allergy to metals as well. Thank you!
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
## 2021-05-07 ENCOUNTER — Other Ambulatory Visit: Payer: Self-pay

## 2021-05-07 MED ORDER — TACROLIMUS 0.1 % EX OINT: TOPICAL_OINTMENT | CUTANEOUS | 2 refills | Status: DC

## 2021-05-07 NOTE — Telephone Encounter (Signed)
I believe we previously submitted for tacrolimus and they would not cover so we ended up on the triamcinolone. Please resubmit for tacrolimus. Thank you!

## 2021-05-07 NOTE — Telephone Encounter (Signed)
Rx sent. Submitted PA - CoverMyMeds states that authorization not needed. Called pt and informed of med change and advised for patch testing is this does not help.

## 2021-06-07 ENCOUNTER — Emergency Department: Payer: Medicare PPO

## 2021-06-07 ENCOUNTER — Other Ambulatory Visit: Payer: Self-pay

## 2021-06-07 ENCOUNTER — Encounter: Payer: Self-pay | Admitting: Emergency Medicine

## 2021-06-07 ENCOUNTER — Emergency Department
Admission: EM | Admit: 2021-06-07 | Discharge: 2021-06-07 | Disposition: A | Discharge status: Home / Self Care | Payer: Medicare PPO | Attending: Emergency Medicine | Admitting: Emergency Medicine

## 2021-06-07 DIAGNOSIS — R0789 Other chest pain: Secondary | ICD-10-CM | POA: Insufficient documentation

## 2021-06-07 DIAGNOSIS — Z7901 Long term (current) use of anticoagulants: Secondary | ICD-10-CM | POA: Diagnosis not present

## 2021-06-07 DIAGNOSIS — F17210 Nicotine dependence, cigarettes, uncomplicated: Secondary | ICD-10-CM | POA: Insufficient documentation

## 2021-06-07 DIAGNOSIS — Z8589 Personal history of malignant neoplasm of other organs and systems: Secondary | ICD-10-CM | POA: Diagnosis not present

## 2021-06-07 LAB — CBC
HCT: 38.2 % (ref 36.0–46.0)
Hemoglobin: 13 g/dL (ref 12.0–15.0)
MCH: 31.6 pg (ref 26.0–34.0)
MCHC: 34 g/dL (ref 30.0–36.0)
MCV: 92.7 fL (ref 80.0–100.0)
Platelets: 203 10*3/uL (ref 150–400)
RBC: 4.12 MIL/uL (ref 3.87–5.11)
RDW: 12 % (ref 11.5–15.5)
WBC: 5.7 10*3/uL (ref 4.0–10.5)
nRBC: 0 % (ref 0.0–0.2)

## 2021-06-07 LAB — BASIC METABOLIC PANEL
Anion gap: 7 (ref 5–15)
BUN: 14 mg/dL (ref 6–20)
CO2: 25 mmol/L (ref 22–32)
Calcium: 8.8 mg/dL — ABNORMAL LOW (ref 8.9–10.3)
Chloride: 107 mmol/L (ref 98–111)
Creatinine, Ser: 1.12 mg/dL — ABNORMAL HIGH (ref 0.44–1.00)
GFR, Estimated: 59 mL/min — ABNORMAL LOW (ref >60)
Glucose, Bld: 140 mg/dL — ABNORMAL HIGH (ref 70–99)
Potassium: 3.8 mmol/L (ref 3.5–5.1)
Sodium: 139 mmol/L (ref 135–145)

## 2021-06-07 LAB — TROPONIN I (HIGH SENSITIVITY)
Troponin I (High Sensitivity): 3 ng/L (ref <18)
Troponin I (High Sensitivity): 3 ng/L (ref <18)

## 2021-06-07 MED ORDER — TRAMADOL HCL 50 MG PO TABS: 50.0000 mg | ORAL_TABLET | Freq: Four times a day (QID) | ORAL | 0 refills | Status: AC | PRN

## 2021-06-07 MED ORDER — OXYCODONE-ACETAMINOPHEN 5-325 MG PO TABS
1.0000 | ORAL_TABLET | Freq: Once | ORAL | Status: AC
  Administered 2021-06-07: 12:00:00 1 via ORAL
  Filled 2021-06-07: qty 1

## 2021-06-07 NOTE — Discharge Instructions (Addendum)
You may continue to take the Flexeril that you are already prescribed before.  If you take one and after a 1-2 hours you are still having pain, you may take the tramadol prescribed today.  Follow-up with your regular doctor.  Return to the ER for new, worsening, or persistent severe chest pain, difficulty breathing, weakness or lightheadedness, leg swelling, fever, or any other new or worsening symptoms that concern you.
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
## 2021-06-07 NOTE — ED Notes (Signed)
Pt taken for CT 
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
## 2021-06-07 NOTE — ED Notes (Signed)
Pt unable to sign for discharge d/t no signature pad

## 2021-07-02 ENCOUNTER — Other Ambulatory Visit: Payer: Self-pay | Admitting: Dermatology

## 2021-07-02 DIAGNOSIS — L259 Unspecified contact dermatitis, unspecified cause: Secondary | ICD-10-CM

## 2021-07-17 ENCOUNTER — Ambulatory Visit (INDEPENDENT_AMBULATORY_CARE_PROVIDER_SITE_OTHER): Payer: Medicare PPO | Admitting: Dermatology

## 2021-07-17 ENCOUNTER — Other Ambulatory Visit: Payer: Self-pay

## 2021-07-17 DIAGNOSIS — D489 Neoplasm of uncertain behavior, unspecified: Secondary | ICD-10-CM

## 2021-07-17 DIAGNOSIS — D2272 Melanocytic nevi of left lower limb, including hip: Secondary | ICD-10-CM

## 2021-07-17 NOTE — Patient Instructions (Addendum)
Recommend Bactine or Hibaclens (chlorhexadine) to wash and apply mupirocin, cover with band-aid once daily.   Wound Care Instructions  Cleanse wound gently with soap and water once a day then pat dry with clean gauze. Apply a thing coat of Petrolatum (petroleum jelly, "Vaseline") over the wound (unless you have an allergy to this). We recommend that you use a new, sterile tube of Vaseline. Do not pick or remove scabs. Do not remove the yellow or white "healing tissue" from the base of the wound.  Cover the wound with fresh, clean, nonstick gauze and secure with paper tape. You may use Band-Aids in place of gauze and tape if the would is small enough, but would recommend trimming much of the tape off as there is often too much. Sometimes Band-Aids can irritate the skin.  You should call the office for your biopsy report after 1 week if you have not already been contacted.  If you experience any problems, such as abnormal amounts of bleeding, swelling, significant bruising, significant pain, or evidence of infection, please call the office immediately.  FOR ADULT SURGERY PATIENTS: If you need something for pain relief you may take 1 extra strength Tylenol (acetaminophen) AND 2 Ibuprofen (200mg  each) together every 4 hours as needed for pain. (do not take these if you are allergic to them or if you have a reason you should not take them.) Typically, you may only need pain medication for 1 to 3 days.    If You Need Anything After Your Visit  If you have any questions or concerns for your doctor, please call our main line at [phone] and press option 4 to reach your doctor's medical assistant. If no one answers, please leave a voicemail as directed and we will return your call as soon as possible. Messages left after 4 pm will be answered the following business day.   You may also send [place] a message via [place]. We typically respond to MyChart messages within 1-2 business days.  For prescription  refills, please ask your pharmacy to contact our office. Our fax number is [phone].  If you have an urgent issue when the clinic is closed that cannot wait until the next business day, you can page your doctor at the number below.    Please note that while we do our best to be available for urgent issues outside of office hours, we are not available 24/7.   If you have an urgent issue and are unable to reach [place], you may choose to seek medical care at your doctor's office, retail clinic, urgent care center, or emergency room.  If you have a medical emergency, please immediately call 911 or go to the emergency department.  Pager Numbers  - Dr. Nehemiah Massed: [phone]  - Dr. Laurence Ferrari: [phone]  - Dr. Nicole Kindred: [phone]  In the event of inclement weather, please call our main line at [phone] for an update on the status of any delays or closures.  Dermatology Medication Tips: Please keep the boxes that topical medications come in in order to help keep track of the instructions about where and how to use these. Pharmacies typically print the medication instructions only on the boxes and not directly on the medication tubes.   If your medication is too expensive, please contact our office at [phone] option 4 or send [place] a message through [place].   We are unable to tell what your co-pay for medications will be in advance as this is different depending on your  insurance coverage. However, we may be able to find a substitute medication at lower cost or fill out paperwork to get insurance to cover a needed medication.   If a prior authorization is required to get your medication covered by your insurance company, please allow [place] 1-2 business days to complete this process.  Drug prices often vary depending on where the prescription is filled and some pharmacies may offer cheaper prices.  The website www.goodrx.com contains coupons for medications through different pharmacies. The  prices here do not account for what the cost may be with help from insurance (it may be cheaper with your insurance), but the website can give you the price if you did not use any insurance.  - You can print the associated coupon and take it with your prescription to the pharmacy.  - You may also stop by our office during regular business hours and pick up a GoodRx coupon card.  - If you need your prescription sent electronically to a different pharmacy, notify our office through [facility] or by phone at [phone] option 4.     Si Usted Necesita Algo Despus de Su Visita  Tambin puede enviarnos un mensaje a travs de Pharmacist, community. Por lo general respondemos a los mensajes de MyChart en el transcurso de 1 a 2 das hbiles.  Para renovar recetas, por favor pida a su farmacia que se ponga en contacto con nuestra oficina. Harland Dingwall de fax es [place] [phone].  Si tiene un asunto urgente cuando la clnica est cerrada y que no puede esperar hasta el siguiente da hbil, puede llamar/localizar a su doctor(a) al nmero que aparece a continuacin.   Por favor, tenga en cuenta que aunque hacemos todo lo posible para estar disponibles para asuntos urgentes fuera del horario de [place], no estamos disponibles las 24 horas del da, los 7 das de la [place].   Si tiene un problema urgente y no puede comunicarse con nosotros, puede optar por buscar atencin mdica  en el consultorio de su doctor(a), en una clnica privada, en un centro de atencin urgente o en una sala de emergencias.  Si tiene Engineering geologist, por favor llame inmediatamente al 911 o vaya a la sala de emergencias.  Nmeros de bper  - Dr. Nehemiah Massed: [phone]  - Dra. Moye: [phone]  - Dra. Nicole Kindred: [phone]  En caso de inclemencias del [place], por favor llame a Johnsie Kindred principal al [phone] para una actualizacin sobre el [place] de cualquier retraso o cierre.  Consejos para la medicacin en  dermatologa: Por favor, guarde las cajas en las que vienen los medicamentos de uso tpico para ayudarle a seguir las instrucciones sobre dnde y cmo usarlos. Las farmacias generalmente imprimen las instrucciones del medicamento slo en las cajas y no directamente en los tubos del [place].   Si su medicamento es muy caro, por favor, pngase en contacto con Zigmund Daniel llamando al [phone] y presione la opcin 4 o envenos un mensaje a travs de Pharmacist, community.   No podemos decirle cul ser su copago por los medicamentos por adelantado ya que esto es diferente dependiendo de la cobertura de su seguro. Sin embargo, es posible que podamos encontrar un medicamento sustituto a Electrical engineer un formulario para que el seguro cubra el medicamento que se considera necesario.   Si se requiere una autorizacin previa para que su compaa de seguros [place] su medicamento, por favor permtanos de 1 a 2 das hbiles para completar este proceso.  Los  precios de los medicamentos varan con frecuencia dependiendo del lugar de dnde se surte la receta y alguna farmacias pueden ofrecer precios ms baratos.  El sitio web www.goodrx.com tiene cupones para medicamentos de Airline pilot. Los precios aqu no tienen en cuenta lo que podra costar con la ayuda del seguro (puede ser ms barato con su seguro), pero el sitio web puede darle el precio si no utiliz Research scientist (physical sciences).  - Puede imprimir el cupn correspondiente y llevarlo con su receta a la farmacia.  - Tambin puede pasar por nuestra oficina durante el horario de atencin regular y Charity fundraiser una tarjeta de cupones de GoodRx.  - Si necesita que su receta se enve electrnicamente a una farmacia diferente, informe a nuestra oficina a travs de MyChart de [place] o por telfono llamando al [phone] y presione la opcin 4.

## 2021-07-17 NOTE — Progress Notes (Signed)
° °  Follow-Up Visit   Subjective  Bonnie Adams is a 51 y.o. female who presents for the following: Follow-up (Patient here today to have nevus at left medial plantar foot rechecked. Patient has not noticed any change.).  The following portions of the chart were reviewed this encounter and updated as appropriate:   Tobacco   Allergies   Meds   Problems   Med Hx   Surg Hx   Fam Hx       Review of Systems:  No other skin or systemic complaints except as noted in HPI or Assessment and Plan.  Objective  Well appearing patient in no apparent distress; mood and affect are within normal limits.  A focused examination was performed including upper extremities, including the arms, hands, fingers, and fingernails and left foot. Relevant physical exam findings are noted in the Assessment and Plan.  Left medial plantar foot 0.55 cm irregular medium to dark brown macule; some changes compared to prior photo R/o Atypia    Assessment & Plan  Neoplasm of uncertain behavior Left medial plantar foot  Epidermal / dermal shaving  Lesion diameter (cm):  0.6 Informed consent: discussed and consent obtained   Timeout: patient name, date of birth, surgical site, and procedure verified   Patient was prepped and draped in usual sterile fashion: area prepped with isopropyl alcohol. Anesthesia: the lesion was anesthetized in a standard fashion   Anesthetic:  1% lidocaine w/ epinephrine 1-100,000 buffered w/ 8.4% NaHCO3 Instrument used: DermaBlade   Hemostasis achieved with: aluminum chloride   Outcome: patient tolerated procedure well   Post-procedure details: wound care instructions given   Additional details:  Mupirocin and a bandage applied  Specimen 1 - Surgical pathology Differential Diagnosis: R/o Atypia  Check Margins: No 0.55 cm irregular medium to dark brown macule   Recommend Bactine or Hibaclens (chlorhexadine) to wash and apply mupirocin, cover with band-aid once daily.    Return if  symptoms worsen or fail to improve.  Graciella Belton, RMA, am acting as scribe for Forest Gleason, MD .  Documentation: I have reviewed the above documentation for accuracy and completeness, and I agree with the above.  Forest Gleason, MD

## 2021-07-22 ENCOUNTER — Telehealth: Payer: Self-pay

## 2021-07-22 NOTE — Telephone Encounter (Signed)
Patient advised bx results showed benign nevus. Lurlean Horns, RMA

## 2021-07-22 NOTE — Telephone Encounter (Signed)
-----   Message from [place], MD sent at 07/22/2021  1:32 PM EST ----- Skin , left medial plantar foot MELANOCYTIC NEVUS, JUNCTIONAL ACRAL TYPE, CLOSE TO MARGIN  This is a NORMAL MOLE under the microscope. No additional treatment is needed.    MAs please call. Thank you!

## 2021-07-24 ENCOUNTER — Encounter: Payer: Self-pay | Admitting: Dermatology

## 2021-08-03 ENCOUNTER — Other Ambulatory Visit: Payer: Self-pay | Admitting: Dermatology

## 2021-08-03 DIAGNOSIS — L259 Unspecified contact dermatitis, unspecified cause: Secondary | ICD-10-CM
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
## 2023-12-16 ENCOUNTER — Encounter (INDEPENDENT_AMBULATORY_CARE_PROVIDER_SITE_OTHER): Payer: Self-pay

## 2024-01-13 ENCOUNTER — Ambulatory Visit (INDEPENDENT_AMBULATORY_CARE_PROVIDER_SITE_OTHER): Admitting: Internal Medicine

## 2024-01-13 ENCOUNTER — Encounter (INDEPENDENT_AMBULATORY_CARE_PROVIDER_SITE_OTHER): Payer: Self-pay | Admitting: Internal Medicine

## 2024-01-13 VITALS — BP 122/82 | HR 77 | Temp 97.0°F | Ht 69.0 in | Wt 231.0 lb

## 2024-01-13 DIAGNOSIS — Z0289 Encounter for other administrative examinations: Secondary | ICD-10-CM

## 2024-01-13 DIAGNOSIS — E7800 Pure hypercholesterolemia, unspecified: Secondary | ICD-10-CM | POA: Diagnosis not present

## 2024-01-13 DIAGNOSIS — E66811 Obesity, class 1: Secondary | ICD-10-CM | POA: Insufficient documentation

## 2024-01-13 DIAGNOSIS — Z6834 Body mass index (BMI) 34.0-34.9, adult: Secondary | ICD-10-CM

## 2024-01-13 DIAGNOSIS — G620 Drug-induced polyneuropathy: Secondary | ICD-10-CM

## 2024-01-13 DIAGNOSIS — C7800 Secondary malignant neoplasm of unspecified lung: Secondary | ICD-10-CM | POA: Diagnosis not present

## 2024-01-13 DIAGNOSIS — T451X5A Adverse effect of antineoplastic and immunosuppressive drugs, initial encounter: Secondary | ICD-10-CM | POA: Diagnosis not present

## 2024-01-13 DIAGNOSIS — E669 Obesity, unspecified: Secondary | ICD-10-CM

## 2024-01-13 NOTE — Assessment & Plan Note (Signed)
 Reviewed outside labs she had an LDL cholesterol between 100-130.  We will check fasting lipid profile and assess cardiovascular risk.  She would benefit from reducing saturated fats in diet to less than 10% of calories.

## 2024-01-13 NOTE — Assessment & Plan Note (Signed)
 Contributing factors: Genetics, history of chemotherapy and possible muscle loss, medications, chronic skipping of meal, suspected slowing of metabolism.  We reviewed anthropometrics, biometrics, associated medical conditions and contributing factors with patient. she would benefit from a medically tailored reduced calorie nutrional plan based on her REE (resting energy expenditure), which will be determined by indirect calorimetry.  We will also assess for cardiometabolic risk and nutritional derangements via fasting labs at intake appointment.

## 2024-01-13 NOTE — Assessment & Plan Note (Signed)
 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  We reviewed the benefits of weight loss as a pertains to cancer.  Continue to follow-up with oncology at [facility].  We will make sure that she is up-to-date on age and gender specific preventive measures at the next office visit

## 2024-01-13 NOTE — Assessment & Plan Note (Signed)
 She is currently on nortriptyline and has her working on reducing medication this medication may cause weight gain.

## 2024-01-13 NOTE — Progress Notes (Signed)
 [address] [place], [place], [place] [address] Office: [phone]  /  Fax: [phone]   Initial Consultation    Bonnie Adams was seen in clinic today to evaluate for obesity. She is interested in losing weight to improve overall health and reduce the risk of weight related complications. She presents today to review program treatment options, initial physical assessment, and evaluation.    Anthropometrics and Bioimpedance Analysis   Body mass index is 34.11 kg/m. Body Fat Mass : 45 % Visceral Fat Mass Rating : 12 Waist to Height Ratio: TBD  Obesity Related Diseases and Complications  Obesity Quality of Life and Psychosocial Complications: Body image dissatisfaction and Reduced health-related quality of life  Cardiometabolic: Dyslipidemia or hypercholesterolemia, Chronic kidney disease or obesity glomerulopathy, and metastatic cancer  Biomechanical: Osteoarthritis of the knee or hip and Low back pain   Weight Related History Bonnie Adams is a 53 year old female with metastatic cancer of unknown origin in the lungs who presents for weight management consultation.  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 The cancer is suspected to be head and neck cancer but remains classified as unknown origin. She underwent two and a half years of chemotherapy, which resulted in chemotoxicity and 'chemo brain.' Chemotherapy was discontinued due to these side effects, and her cancer has remained stable since then.  She has experienced weight gain following her cancer diagnosis and chemotherapy, particularly after a hysterectomy in 2013. Her current weight is 260 pounds, the highest recorded. She has attempted various weight loss programs, including Thrive and phentermine, with some success but regained weight after discontinuation. She also tried [place] without success. The weight gain has affected her mood and  self-confidence.  She experiences knee and lower back pain, which she attributes to her weight. She has high cholesterol and is currently taking lovastatin. She has chronic kidney disease stage 3B. Her kidney function tests were slightly elevated prior to chemotherapy, considered her norm.  She does not have sleep apnea but reports interrupted sleep due to frequent bathroom visits. She has high stress levels currently, although typically her stress is low. Her physical activity has increased, and she exercises at least three days a week, although she experiences joint pain and fatigue that sometimes require two days of recovery after exercise.  She has a history of skipping meals, particularly breakfast, and typically consumes one full meal a day with some fruit. She has cravings for sweets and struggles with these cravings. She has been slender and athletic most of her life.  She was referred by: Specialist  When asked what they would like to accomplish? She states: Adopt a healthier eating pattern and lifestyle, Improve energy levels and physical activity, Improve existing medical conditions, Improve quality of life, Improve appearance, and Improve self-confidence  Weight history: See above  Highest weight: 260  Contributing factors: consumption of processed foods, moderate to high levels of stress, chronic skipping of meals, menopause, and multiple weight loss attempts in the past  Prior weight loss attempts: Thrive got down to 215  Current or previous pharmacotherapy: Phentermine and Other: wegovy, no weigth loss on Wegovy  Response to medication: Lost weight initially but was unable to sustain weight loss  Current nutrition plan: None  Greatest challenge with dieting: difficulty maintaining reduced calorie state.  Current level of physical activity: Other: Some  Barriers to Exercise: energy  Readiness and Motivation  On a scale from 0 to 10  How ready are you to make changes to  your eating and physical activity to lose weight? 9 How important is it for you to lose weight right now ? 10 How confident are you that you can lose weight if you try? 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    General:  Alert, oriented and cooperative. Patient is in no acute distress.  Respiratory: Normal respiratory effort, no problems with respiration noted   Gait: able to ambulate independently  Mental Status: Normal mood and affect. Normal behavior. Normal judgment and thought content.   Diagnostic Data Reviewed  BMET    Component Value Date/Time   NA 139 06/07/2021 0825   NA 135 (L) 01/02/2014 1634   K 3.8 06/07/2021 0825   K 3.9 01/02/2014 1634   CL 107 06/07/2021 0825   CL 102 01/02/2014 1634   CO2 25 06/07/2021 0825   CO2 28 01/02/2014 1634   GLUCOSE 140 (H) 06/07/2021 0825   GLUCOSE 86 01/02/2014 1634   BUN 14 06/07/2021 0825   BUN 10 01/02/2014 1634   CREATININE 1.12 (H) 06/07/2021 0825   CREATININE 0.91 01/02/2014 1634   CALCIUM 8.8 (L) 06/07/2021 0825   CALCIUM 8.8 01/02/2014 1634   GFRNONAA 59 (L) 06/07/2021 0825   GFRNONAA >60 01/02/2014 1634   GFRAA >60 03/27/2018 0736   GFRAA >60 01/02/2014 1634   No results found for: HGBA1C No results found for: INSULIN CBC    Component Value Date/Time   WBC 5.7 06/07/2021 0825   RBC 4.12 06/07/2021 0825   HGB 13.0 06/07/2021 0825   HGB 12.6 01/02/2014 1634   HCT 38.2 06/07/2021 0825   HCT 37.1 01/02/2014 1634   PLT 203 06/07/2021 0825   PLT 96 (L) 01/02/2014 1634   MCV 92.7 06/07/2021 0825   MCV 100 01/02/2014 1634   MCH 31.6 06/07/2021 0825   MCHC 34.0 06/07/2021 0825   RDW 12.0  06/07/2021 0825   RDW 14.5 01/02/2014 1634   Iron/TIBC/Ferritin/ %Sat No results found for: IRON, TIBC, FERRITIN, IRONPCTSAT Lipid Panel  No results found for: CHOL, TRIG, HDL, CHOLHDL, VLDL, LDLCALC, LDLDIRECT Hepatic Function Panel     Component Value Date/Time   PROT 6.9 03/27/2018 0736   PROT 7.2 01/02/2014 1634   ALBUMIN 3.4 (L) 03/27/2018 0736   ALBUMIN 3.2 (L) 01/02/2014 1634   AST 15 03/27/2018 0736   AST 26 01/02/2014 1634   ALT 9 03/27/2018 0736   ALT 27 01/02/2014 1634   ALKPHOS 107 03/27/2018 0736   ALKPHOS 104 01/02/2014 1634   BILITOT 0.6 03/27/2018 0736   BILITOT 0.4 01/02/2014 1634   No results found for: TSH  Medications  Outpatient Encounter Medications as of 01/13/2024  Medication Sig   apixaban  (ELIQUIS ) 5 MG TABS tablet Take 5 mg by mouth 2 (two) times daily.   ascorbic acid (VITAMIN C) 1000 MG tablet Take 1,000 mg by mouth.   clidinium-chlordiazePOXIDE (LIBRAX) 5-2.5 MG capsule Daily each morning and up to one additional capsule if  needed later in the day for cramping around bowel movements   Coenzyme Q10 10 MG capsule Take by mouth.   DULoxetine  (CYMBALTA ) 60 MG capsule Take 60 mg by mouth daily.   ergocalciferol (VITAMIN D2) 1.25 MG (50000 UT) capsule Take by mouth.   furosemide  (LASIX ) 20 MG tablet Take 1 tablet by mouth 2 (two) times daily.   HYDROcodone -acetaminophen  (NORCO) 5-325 MG tablet Take 1 tablet by mouth every 4 (four) hours as needed for moderate pain.   nortriptyline (PAMELOR) 50 MG capsule Take by mouth.   pimecrolimus  (ELIDEL ) 1 % cream Apply topically 2 (two) times daily as needed. For rash   Probiotic Product (RISAQUAD-2) CAPS Take 1 capsule by mouth every morning.   spironolactone  (ALDACTONE ) 25 MG tablet Take 1 tablet by mouth daily.   SUMAtriptan (IMITREX) 100 MG tablet Take by mouth.   tacrolimus  (PROTOPIC ) 0.1 % ointment Apply to affected area daily.   topiramate  (TOPAMAX ) 100 MG tablet Take 1 tablet by  mouth at bedtime.   traZODone (DESYREL) 50 MG tablet Take by mouth.   triamcinolone  cream (KENALOG ) 0.1 % APPLY TWICE DAILY AS NEEDED TO AFFECTED AREAS FOR TWO WEEKS THEN APPLY TWICE DAILY ON WEEKENDS ONLY.   No facility-administered encounter medications on file as of 01/13/2024.     Assessment and Plan   Malignant neoplasm metastatic to lung, unspecified laterality [facility]) Assessment & Plan: Patient is currently going to [facility] for active surveillance she has metastatic lung cancer of unknown primary but suspected to be head and neck cancer.  We reviewed the benefits of weight loss as a pertains to cancer.  Continue to follow-up with oncology at [facility].  We will make sure that she is up-to-date on age and gender specific preventive measures at the next office visit   Class 1 obesity with serious comorbidity and body mass index (BMI) of 34.0 to 34.9 in adult, unspecified obesity type Assessment & Plan: Contributing factors: Genetics, history of chemotherapy and possible muscle loss, medications, chronic skipping of meal, suspected slowing of metabolism.  We reviewed anthropometrics, biometrics, associated medical conditions and contributing factors with patient. she would benefit from a medically tailored reduced calorie nutrional plan based on her REE (resting energy expenditure), which will be determined by indirect calorimetry.  We will also assess for cardiometabolic risk and nutritional derangements via fasting labs at intake appointment.     Pure hypercholesterolemia Assessment & Plan: Reviewed outside labs she had an LDL cholesterol between 100-130.  We will check fasting lipid profile and assess cardiovascular risk.  She would benefit from reducing saturated fats in diet to less than 10% of calories.   Chemotherapy-induced neuropathy (HCC) Assessment & Plan: She is currently on nortriptyline and has her working on reducing medication this medication may cause weight gain.         Obesity Treatment and Action Plan:  Patient will work on garnering support from family and friends to begin weight loss journey. Will work on eliminating or reducing the presence of highly palatable, calorie dense foods in the home. Will complete provided nutritional and psychosocial assessment questionnaire before the next appointment. Will be scheduled for indirect calorimetry to determine resting energy expenditure in a fasting state.  This will allow us  to create a reduced calorie, high-protein meal plan to promote loss of fat mass while preserving muscle mass. Counseled on the health benefits of losing 5%-15% of total body weight. Was counseled on nutritional approaches to weight loss and benefits of reducing processed foods and  consuming plant-based foods and high quality protein as part of nutritional weight management. Was counseled on pharmacotherapy and role as an adjunct in weight management.   Education and Additional resources  She was weighed on the bioimpedance scale and results were discussed and documented in the synopsis.  We discussed obesity as a progressive, chronic disease and the importance of a more detailed evaluation of all the factors contributing to the disease.  We reviewed the basic principles in obesity management.   We discussed the importance of long term lifestyle changes which include nutrition, exercise and behavioral modification as well as the importance of customizing this to her specific health and social needs.  We reviewed the role of medical interventions including pharmacotherapy and surgical interventions.   We discussed the benefits of reaching a healthier weight to alleviate the symptoms of existing conditions and reduce the risks of the biomechanical, cardiometabolic and psychological effects of obesity.  We reviewed our program approach and philosophy, which are guided by the four pillars of obesity medicine.  We discussed how to prepare for  intake appointment and the importance of fasting and avoidance of stimulants for at least 8 hours prior to indirect calorimetry.  Alletta Maffeo appears to be in the action stage of change and reports being ready to initiate intensive lifestyle and behavioral modifications as part of their weight loss journey.  Attestation  Reviewed by clinician on day of visit: allergies, medications, problem list, medical history, surgical history, family history, social history, and previous encounter notes pertinent to obesity diagnosis.  I have spent 51 minutes in the care of the patient today including: 3 minutes before the visit reviewing and preparing the chart. 38 minutes face-to-face assessing and reviewing listed medical problems as outlined in obesity care plan, providing nutritional and behavioral counseling on topics outlined in the obesity care plan, independently interpreting test results and goals of care, as described in assessment and plan, reviewing and discussing biometric information and progress, and reviewing records from [facility] 10 minutes after the visit updating chart and documentation of encounter.   Lucas Parker, MD, ABIM, [place]

## 2024-03-23 ENCOUNTER — Ambulatory Visit (INDEPENDENT_AMBULATORY_CARE_PROVIDER_SITE_OTHER): Admitting: Internal Medicine

## 2024-03-23 ENCOUNTER — Encounter (INDEPENDENT_AMBULATORY_CARE_PROVIDER_SITE_OTHER): Payer: Self-pay | Admitting: Internal Medicine

## 2024-03-23 VITALS — BP 129/86 | HR 61 | Temp 97.6°F | Ht 69.0 in | Wt 230.0 lb

## 2024-03-23 DIAGNOSIS — N1831 Chronic kidney disease, stage 3a: Secondary | ICD-10-CM | POA: Diagnosis not present

## 2024-03-23 DIAGNOSIS — Z1331 Encounter for screening for depression: Secondary | ICD-10-CM | POA: Diagnosis not present

## 2024-03-23 DIAGNOSIS — Z7901 Long term (current) use of anticoagulants: Secondary | ICD-10-CM

## 2024-03-23 DIAGNOSIS — E66811 Obesity, class 1: Secondary | ICD-10-CM

## 2024-03-23 DIAGNOSIS — R5383 Other fatigue: Secondary | ICD-10-CM | POA: Diagnosis not present

## 2024-03-23 DIAGNOSIS — R0602 Shortness of breath: Secondary | ICD-10-CM | POA: Diagnosis not present

## 2024-03-23 DIAGNOSIS — Z6834 Body mass index (BMI) 34.0-34.9, adult: Secondary | ICD-10-CM

## 2024-03-23 DIAGNOSIS — I2699 Other pulmonary embolism without acute cor pulmonale: Secondary | ICD-10-CM | POA: Diagnosis not present

## 2024-03-23 DIAGNOSIS — F1911 Other psychoactive substance abuse, in remission: Secondary | ICD-10-CM

## 2024-03-23 DIAGNOSIS — E7800 Pure hypercholesterolemia, unspecified: Secondary | ICD-10-CM

## 2024-03-23 NOTE — Assessment & Plan Note (Signed)
 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  Discussed the risks of bleeding associated with anticoagulation therapy, including the need for caution with cuts and head injuries. Emphasized the importance of monitoring for signs of anemia due to potential microscopic bleeding. - Order complete blood count (CBC) to monitor for anemia.

## 2024-03-23 NOTE — Progress Notes (Signed)
 [address],  [place], [place] [address]  Office: [phone]  /  Fax: [phone]   Subjective   Initial Visit  Bonnie Adams (MR# [id number]) is a 53 y.o. female who presents for evaluation and treatment of obesity and related comorbidities. Current BMI is Body mass index is 33.97 kg/m. Bonnie Adams has been struggling with her weight for many years and has been unsuccessful in either losing weight, maintaining weight loss, or reaching her healthy weight goal.  Bonnie Adams is currently in the action stage of change and ready to dedicate time achieving and maintaining a healthier weight. Bonnie Adams is interested in becoming our patient and working on intensive lifestyle modifications including (but not limited to) diet and exercise for weight loss.  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 The cancer is suspected to be head and neck cancer but remains classified as unknown origin. She underwent two and a half years of chemotherapy, which resulted in chemotoxicity and 'chemo brain.' Chemotherapy was discontinued due to these side effects, and her cancer has remained stable since then.   She has experienced weight gain following her cancer diagnosis and chemotherapy, particularly after a hysterectomy in 2013. Her current weight is 260 pounds, the highest recorded. She has attempted various  weight loss programs, including Thrive and phentermine, with some success but regained weight after discontinuation. She also tried [place] without success. The weight gain has affected her mood and self-confidence.   She experiences knee and lower back pain, which she attributes to her weight. She has high cholesterol and is currently taking lovastatin. She has chronic kidney disease stage 3B. Her kidney function tests were slightly elevated prior to chemotherapy, considered her norm.   She does not have sleep apnea but reports interrupted sleep due to frequent bathroom visits. She has high stress levels currently, although typically her stress is low. Her physical activity has increased, and she exercises at least three days a week, although she experiences joint pain and fatigue that sometimes require two days of recovery after exercise.   She has a history of skipping meals, particularly breakfast, and typically consumes one full meal a day with some fruit. She has cravings for sweets and struggles with these cravings. She has been slender and athletic most of her life.   She was referred by: Specialist   When asked what they would like to accomplish? She states: Adopt a healthier eating pattern and lifestyle, Improve energy levels and physical activity, Improve existing medical conditions, Improve quality of life, Improve appearance, and Improve self-confidence   Weight history: See above   Highest weight: 260   Contributing factors: consumption of processed foods, moderate to high levels of stress, chronic skipping of meals, menopause, and multiple weight loss attempts in the past   Prior weight loss attempts: Thrive got down to 215   Current or previous pharmacotherapy: Phentermine and Other: wegovy, no weigth loss on Wegovy   Response to medication: Lost weight initially  but was unable to sustain weight loss   Current nutrition plan: None   Greatest challenge with dieting: difficulty  maintaining reduced calorie state.   Current level of physical activity: Other: Some   Barriers to Exercise: energy   Readiness and Motivation   On a scale from 0 to 10 How ready are you to make changes to your eating and physical activity to lose weight? 9 How important is it for you to lose weight right now ? 10 How confident are you that you can lose weight if you try? 3   Nutritional History:  Current nutrition plan: None.  How many times do you eat outside the home: 1-2 per week  How often do they skip meals: skips breakfast  What beverages do they drink: caffeinated beverages , regular soda , smoothies, and sweet tea .   Use of artificial sweetners : No  Food intolerances or dislikes: none.  Food triggers: Boredom and To help comfort self.  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    Anthropometrics:  Vitals Temp: 97.6 F (36.4 C) BP: 129/86 Pulse Rate: 61 SpO2: 97 %   Anthropometric Measurements Height: 5' 9 (1.753 m) Weight: 230 lb (104.3 kg) BMI (Calculated): 33.95 Starting Weight: 230 lb Peak Weight: 270 lb Waist Measurement : 44 inches   Body Composition  Body Fat %: 43.5 % Fat Mass (lbs): 100.2 lbs Muscle Mass (lbs): 123.4 lbs Total Body Water (lbs): 83.2 lbs Visceral Fat Rating : 12   Other Clinical Data RMR: 1613 Fasting: yes Labs: yes Today's Visit #: 1 Starting Date:  03/23/24    Physical Exam:  General: She is overweight, cooperative, alert, well developed, and in no acute distress. PSYCH: Has normal mood, affect and thought process.   HEENT: EOMI, sclerae are anicteric. Lungs: Normal breathing effort, no conversational dyspnea. Extremities: No edema.  Neurologic: No gross sensory or motor deficits. No tremors or fasciculations noted.    Diagnostic Data Reviewed  Indirect Calorimeter completed today shows a VO2 of 234 and a REE of 1613.  Her calculated basal metabolic rate is 8192 thus her resting energy expenditure slower than calculated.  Depression Screen  Tayvia's PHQ-9 score was: 8.     03/23/2024    8:47 AM  Depression screen PHQ 2/9  Decreased Interest 1  Down, Depressed, Hopeless 1  PHQ - 2 Score 2  Altered sleeping 1  Tired, decreased energy 1  Change in appetite 1  Feeling bad or failure about yourself  1  Trouble concentrating 1  Moving slowly or fidgety/restless 1  Suicidal thoughts 0  PHQ-9 Score 8  Difficult doing work/chores Somewhat difficult    Screening for Sleep Related Breathing Disorders  Bonnie Adams admits to daytime somnolence and admits to waking up still tired.  Patient has a history of symptoms of daytime fatigue and morning fatigue. Livvy generally gets 5 to 7 hours of sleep per night, and states that she does not sleep well most nights. Snoring is present. Apneic episodes are not present. Epworth Sleepiness Score is 6.   BMET    Component Value Date/Time   NA 139 06/07/2021 0825   NA 135 (L) 01/02/2014 1634   K 3.8 06/07/2021 0825   K 3.9 01/02/2014 1634   CL 107 06/07/2021 0825   CL 102 01/02/2014 1634   CO2 25 06/07/2021 0825   CO2 28 01/02/2014 1634   GLUCOSE 140 (H) 06/07/2021 0825   GLUCOSE 86 01/02/2014 1634   BUN 14 06/07/2021 0825   BUN 10 01/02/2014 1634   CREATININE 1.12 (H) 06/07/2021 0825   CREATININE 0.91 01/02/2014 1634   CALCIUM 8.8 (L) 06/07/2021 0825   CALCIUM 8.8 01/02/2014  1634   GFRNONAA 59 (L) 06/07/2021 0825   GFRNONAA >60 01/02/2014 1634   GFRAA >60 03/27/2018 0736   GFRAA >60 01/02/2014 1634   No results found for: HGBA1C No results found for: INSULIN CBC    Component Value Date/Time   WBC 5.7 06/07/2021 0825   RBC 4.12 06/07/2021 0825   HGB 13.0 06/07/2021 0825   HGB 12.6 01/02/2014 1634   HCT 38.2 06/07/2021 0825   HCT 37.1 01/02/2014 1634   PLT 203 06/07/2021 0825   PLT 96 (L) 01/02/2014 1634   MCV 92.7 06/07/2021 0825   MCV 100 01/02/2014 1634   MCH 31.6 06/07/2021 0825   MCHC 34.0 06/07/2021 0825   RDW 12.0 06/07/2021 0825   RDW 14.5 01/02/2014 1634   Iron/TIBC/Ferritin/ %Sat No results found for: IRON, TIBC, FERRITIN, IRONPCTSAT Lipid Panel  No results found for: CHOL, TRIG, HDL, CHOLHDL, VLDL, LDLCALC, LDLDIRECT Hepatic Function Panel     Component Value Date/Time   PROT 6.9 03/27/2018 0736   PROT 7.2 01/02/2014 1634   ALBUMIN 3.4 (L) 03/27/2018 0736   ALBUMIN 3.2 (L) 01/02/2014 1634   AST 15 03/27/2018 0736   AST 26 01/02/2014 1634   ALT 9 03/27/2018 0736   ALT 27 01/02/2014 1634   ALKPHOS 107 03/27/2018 0736   ALKPHOS 104 01/02/2014 1634   BILITOT 0.6 03/27/2018 0736   BILITOT 0.4 01/02/2014 1634   No results found for: TSH   Assessment and Plan   TREATMENT PLAN FOR OBESITY:  Recommended Dietary Goals  Bonnie Adams is currently in the action stage of change. As such, her goal is to implement medically supervised obesity management plan.  She has agreed to implement: the Category 1 plan - 1000 kcal per day, prepackaged healthy meals for convenience, and 1-2 meal replacements a day for convenience   Behavioral Intervention  We discussed the following Behavioral Modification Strategies today: increasing lean protein intake to established goals, decreasing simple carbohydrates , increasing vegetables, increasing lower glycemic fruits, increasing fiber rich foods, avoiding skipping meals,  increasing water intake, work on meal planning and preparation, work on tracking and journaling calories using tracking application, reading food labels , keeping healthy foods at home, identifying sources and decreasing liquid calories, decreasing eating out or consumption of processed foods, and making healthy choices when eating convenient foods, planning for success, and better snacking choices  Additional resources provided today: Handout on healthy eating and balanced plate, Handout on complex carbohydrates and lean sources of protein, Category 1 packet, and Handout principles of weight management  Recommended Physical Activity Goals  Bonnie Adams has been  advised to work up to 150 minutes of moderate intensity aerobic activity a week and strengthening exercises 2-3 times per week for cardiovascular health, weight loss maintenance and preservation of muscle mass.   She has agreed to :  Think about enjoyable ways to increase daily physical activity and overcoming barriers to exercise, Increase physical activity in their day and reduce sedentary time (increase NEAT)., Increase volume of physical activity to a goal of 240 minutes a week, and Combine aerobic and strengthening exercises for efficiency and improved cardiometabolic health.  Medical Interventions and Pharmacotherapy We will work on building a Therapist, art and behavioral strategies. We will discuss the role of pharmacotherapy as an adjunct at subsequent visits.   ASSOCIATED CONDITIONS ADDRESSED TODAY  Other Fatigue Bonnie Adams admits to daytime somnolence and admits to waking up still tired. Patient has a history of symptoms of daytime fatigue and morning fatigue. Bonnie Adams generally gets 5 to 7 hours of sleep per night, and states that she does not sleep well most nights. Snoring is present. Apneic episodes are not present. Epworth Sleepiness Score is 6. . Bonnie Adams does feel that her weight is causing her energy to be  lower than it should be. Fatigue may be related to obesity, depression or many other causes. Labs will be ordered, and in the meanwhile, Kynzlie will focus on self care including making healthy food choices, increasing physical activity and focusing on stress reduction.  Shortness of Breath Bonnie Adams notes increasing shortness of breath with physical activity and seems to be worsening over time with weight gain. She notes getting out of breath sooner with activity than she used to. This has not gotten worse recently. Bonnie Adams denies shortness of breath at rest or orthopnea.  Assessment & Plan Other fatigue  Depression screen  SOB (shortness of breath) on exertion  Current use of long term anticoagulation Pulmonary embolism, unspecified chronicity, unspecified pulmonary embolism type, unspecified whether acute cor pulmonale present (HCC) On long-term anticoagulation with Eliquis  likely due to history of PE on the background of malignancy.  Discussed the risks of bleeding associated with anticoagulation therapy, including the need for caution with cuts and head injuries. Emphasized the importance of monitoring for signs of anemia due to potential microscopic bleeding. - Order complete blood count (CBC) to monitor for anemia. Pure hypercholesterolemia I reviewed labs from [facility] she has an elevated LDL cholesterol and we will repeat fasting lipid profile today and assess cardiovascular risk at the next office visit.  She has been on lipid-lowering therapy in the past but not at present. Class 1 obesity with serious comorbidity and body mass index (BMI) of 34.0 to 34.9 in adult, unspecified obesity type Contributing factors: Chronic skipping of meals, menopause, enticing environment, genetics, use of THC for medical reasons, emotional hunger Obesity and medical weight management Obesity management with focus on nutrition and calorie balance. Current weight maintenance with a slight weight  loss of one pound since last visit. Discussed the impact of menopause on metabolism and body composition, leading to increased fat in the trunk and thighs, and decreased muscle mass. Emphasized the importance of protein intake and hydration. Discussed the role of emotional eating and the impact of THC on appetite. Explained the need for a calorie deficit of 500 calories per day to achieve weight loss of one pound per week. Highlighted the genetic factors affecting weight loss in minority groups, requiring a higher degree of calorie restriction. - Implement a meal plan with a target of 1100 calories per  day. - Encourage intake of 70-90 grams of protein daily. - Advise on hydration with at least 90 ounces of water per day. - Provide educational materials on emotional eating and healthy eating principles. - Encourage reduction of sugary drinks and snacks. - Discuss the importance of controlling the home environment to reduce exposure to unhealthy snacks. Chronic kidney disease, stage 3a (HCC) Chronic kidney disease stage 3A with a GFR of 53. Discussed the potential impact of spironolactone  on kidney function and the importance of adequate hydration. Suspected that dehydration may be contributing to reduced kidney function rather than intrinsic kidney disease. - Order blood tests to recheck kidney function. - Encourage increased water intake to improve hydration approximately 90 ounces per day - Avoid nephrotoxins  - Check renal parameters today Substance abuse in remission [facility]) Substance use disorder in remission, with a history of cocaine use. Currently using THC products for pain management. Discussed the impact of THC on appetite and the challenges it presents in weight management.  Because of prior use of stimulants she is not a good candidate for sympathomimetics for weight management.     Follow-up  She was informed of the importance of frequent follow-up visits to maximize her success with  intensive lifestyle modifications for her multiple health conditions. She was informed we would discuss her lab results at her next visit unless there is a critical issue that needs to be addressed sooner. Bonnie Adams agreed to keep her next visit at the agreed upon time to discuss these results.  Attestation Statement  This is the patient's intake visit at [facility]. The patient's Health Questionnaire was reviewed at length. Included in the packet: current and past health history, medications, allergies, ROS, gynecologic history (women only), surgical history, family history, social history, weight history, weight loss surgery history (for those that have had weight loss surgery), nutritional evaluation, mood and food questionnaire, PHQ9, Epworth questionnaire, sleep habits questionnaire, patient life and health improvement goals questionnaire. These will all be scanned into the patient's chart under media.   During the visit, I independently reviewed the patient's , previous labs, bioimpedance scale results, and indirect calorimetry results. I used this information to medically tailor a meal plan for the patient that will help her to lose weight and will improve her obesity-related conditions. I performed a medically necessary appropriate examination and/or evaluation. I discussed the assessment and treatment plan with the patient. The patient was provided an opportunity to ask questions and all were answered. The patient agreed with the plan and demonstrated an understanding of the instructions. Labs were ordered at this visit and will be reviewed at the next visit unless critical results need to be addressed immediately. Clinical information was updated and documented in the EMR.   In addition, they received basic education on identification of processed foods and reduction of these, different sources of lean proteins and complex carbohydrates and how to eat balanced by incorporation of  whole foods.  Reviewed by clinician on day of visit: allergies, medications, problem list, medical history, surgical history, family history, social history, and previous encounter notes.  I have spent 60 minutes in the care of the patient today including: 3 minutes before the visit reviewing and preparing the chart. 45 minutes face-to-face assessing and reviewing listed medical problems as outlined in obesity care plan, providing nutritional and behavioral counseling on topics outlined in the obesity care plan, independently interpreting test results and goals of care, as described in assessment and plan, reviewing and discussing  biometric information and progress, reviewing latest PCP notes and specialist consultations, and ordering diagnostics - see orders 12 minutes after the visit updating chart and documentation of encounter.       Lucas Parker, MD

## 2024-03-23 NOTE — Assessment & Plan Note (Signed)
 I reviewed labs from [facility] she has an elevated LDL cholesterol and we will repeat fasting lipid profile today and assess cardiovascular risk at the next office visit.  She has been on lipid-lowering therapy in the past but not at present.

## 2024-03-23 NOTE — Assessment & Plan Note (Signed)
 Substance use disorder in remission, with a history of cocaine use. Currently using THC products for pain management. Discussed the impact of THC on appetite and the challenges it presents in weight management.  Because of prior use of stimulants she is not a good candidate for sympathomimetics for weight management.

## 2024-03-23 NOTE — Assessment & Plan Note (Signed)
 Chronic kidney disease stage 3A with a GFR of 53. Discussed the potential impact of spironolactone  on kidney function and the importance of adequate hydration. Suspected that dehydration may be contributing to reduced kidney function rather than intrinsic kidney disease. - Order blood tests to recheck kidney function. - Encourage increased water intake to improve hydration approximately 90 ounces per day - Avoid nephrotoxins  - Check renal parameters today

## 2024-03-23 NOTE — Assessment & Plan Note (Signed)
 Contributing factors: Chronic skipping of meals, menopause, enticing environment, genetics, use of THC for medical reasons, emotional hunger Obesity and medical weight management Obesity management with focus on nutrition and calorie balance. Current weight maintenance with a slight weight loss of one pound since last visit. Discussed the impact of menopause on metabolism and body composition, leading to increased fat in the trunk and thighs, and decreased muscle mass. Emphasized the importance of protein intake and hydration. Discussed the role of emotional eating and the impact of THC on appetite. Explained the need for a calorie deficit of 500 calories per day to achieve weight loss of one pound per week. Highlighted the genetic factors affecting weight loss in minority groups, requiring a higher degree of calorie restriction. - Implement a meal plan with a target of 1100 calories per day. - Encourage intake of 70-90 grams of protein daily. - Advise on hydration with at least 90 ounces of water per day. - Provide educational materials on emotional eating and healthy eating principles. - Encourage reduction of sugary drinks and snacks. - Discuss the importance of controlling the home environment to reduce exposure to unhealthy snacks.

## 2024-03-24 LAB — LIPID PANEL WITH LDL/HDL RATIO
Cholesterol, Total: 267 mg/dL — ABNORMAL HIGH (ref 100–199)
HDL: 52 mg/dL (ref >39)
LDL Chol Calc (NIH): 197 mg/dL — ABNORMAL HIGH (ref 0–99)
LDL/HDL Ratio: 3.8 ratio — ABNORMAL HIGH (ref 0.0–3.2)
Triglycerides: 104 mg/dL (ref 0–149)
VLDL Cholesterol Cal: 18 mg/dL (ref 5–40)

## 2024-03-24 LAB — CBC WITH DIFFERENTIAL/PLATELET
Basophils Absolute: 0 x10E3/uL (ref 0.0–0.2)
Basos: 1 %
EOS (ABSOLUTE): 0.1 x10E3/uL (ref 0.0–0.4)
Eos: 1 %
Hematocrit: 41.7 % (ref 34.0–46.6)
Hemoglobin: 13.9 g/dL (ref 11.1–15.9)
Immature Grans (Abs): 0 x10E3/uL (ref 0.0–0.1)
Immature Granulocytes: 0 %
Lymphocytes Absolute: 1.7 x10E3/uL (ref 0.7–3.1)
Lymphs: 30 %
MCH: 32 pg (ref 26.6–33.0)
MCHC: 33.3 g/dL (ref 31.5–35.7)
MCV: 96 fL (ref 79–97)
Monocytes Absolute: 0.4 x10E3/uL (ref 0.1–0.9)
Monocytes: 6 %
Neutrophils Absolute: 3.5 x10E3/uL (ref 1.4–7.0)
Neutrophils: 62 %
Platelets: 195 x10E3/uL (ref 150–450)
RBC: 4.35 x10E6/uL (ref 3.77–5.28)
RDW: 12.6 % (ref 11.7–15.4)
WBC: 5.7 x10E3/uL (ref 3.4–10.8)

## 2024-03-24 LAB — COMPREHENSIVE METABOLIC PANEL WITH GFR
ALT: 15 IU/L (ref 0–32)
AST: 20 IU/L (ref 0–40)
Albumin: 4.5 g/dL (ref 3.8–4.9)
Alkaline Phosphatase: 146 IU/L — ABNORMAL HIGH (ref 49–135)
BUN/Creatinine Ratio: 14 (ref 9–23)
BUN: 15 mg/dL (ref 6–24)
Bilirubin Total: 0.4 mg/dL (ref 0.0–1.2)
CO2: 18 mmol/L — ABNORMAL LOW (ref 20–29)
Calcium: 9.9 mg/dL (ref 8.7–10.2)
Chloride: 102 mmol/L (ref 96–106)
Creatinine, Ser: 1.09 mg/dL — ABNORMAL HIGH (ref 0.57–1.00)
Globulin, Total: 2.7 g/dL (ref 1.5–4.5)
Glucose: 87 mg/dL (ref 70–99)
Potassium: 4.3 mmol/L (ref 3.5–5.2)
Sodium: 139 mmol/L (ref 134–144)
Total Protein: 7.2 g/dL (ref 6.0–8.5)
eGFR: 60 mL/min/1.73 (ref >59)

## 2024-03-24 LAB — HEMOGLOBIN A1C
Est. average glucose Bld gHb Est-mCnc: 108 mg/dL
Hgb A1c MFr Bld: 5.4 % (ref 4.8–5.6)

## 2024-03-24 LAB — INSULIN, RANDOM: INSULIN: 12.1 u[IU]/mL (ref 2.6–24.9)

## 2024-04-06 ENCOUNTER — Ambulatory Visit (INDEPENDENT_AMBULATORY_CARE_PROVIDER_SITE_OTHER): Admitting: Internal Medicine

## 2024-04-06 ENCOUNTER — Encounter (INDEPENDENT_AMBULATORY_CARE_PROVIDER_SITE_OTHER): Payer: Self-pay | Admitting: Internal Medicine

## 2024-04-06 VITALS — BP 131/90 | HR 58 | Temp 97.7°F | Ht 69.0 in | Wt 235.0 lb

## 2024-04-06 DIAGNOSIS — N1831 Chronic kidney disease, stage 3a: Secondary | ICD-10-CM | POA: Diagnosis not present

## 2024-04-06 DIAGNOSIS — E66811 Obesity, class 1: Secondary | ICD-10-CM

## 2024-04-06 DIAGNOSIS — E7800 Pure hypercholesterolemia, unspecified: Secondary | ICD-10-CM

## 2024-04-06 DIAGNOSIS — Z6834 Body mass index (BMI) 34.0-34.9, adult: Secondary | ICD-10-CM

## 2024-04-06 DIAGNOSIS — R748 Abnormal levels of other serum enzymes: Secondary | ICD-10-CM

## 2024-04-06 NOTE — Assessment & Plan Note (Signed)
 Her GFR was at 60 which has improved.  She is on spironolactone  which may effect GFR especially with inadequate hydration.  She is to work on increasing fluid avoiding nephrotoxins.

## 2024-04-06 NOTE — Assessment & Plan Note (Signed)
 Reviewed all labs completed during intake appointment.  Aside from mild elevation in insulin she does not have significant problems with glucose metabolism. Obesity management is challenging due to environmental factors and physical limitations. Current metabolic rate is 8399 calories, with a target of 1100 calories per day. Difficulty adhering to dietary recommendations due to living situation and lack of kitchen resources. Physical ailments hinder exercise capacity. - Adhere to a target of 1100 calories per day. - Focus on meal frequency and protein intake to boost metabolism. - Incorporate small, manageable physical activities, such as walking for 5-10 minutes, three times a day. - Consider intermittent fasting with an 8-hour eating window and three meals within that period. - Use protein smoothies as meal replacements and ensure they are within a 300-calorie limit. - Utilize a balanced plate method for meal planning, focusing on protein and vegetables.

## 2024-04-06 NOTE — Assessment & Plan Note (Signed)
 Cholesterol level is very high at 187, likely due to genetic factors. Previous use of lovastatin was discontinued due to concerns about kidney function. High cholesterol increases cardiovascular risk to 7.2%, which is moderate risk for heart attack and stroke. - Reinitiate cholesterol medication to manage high cholesterol levels and reduce cardiovascular risk.

## 2024-04-06 NOTE — Progress Notes (Signed)
 Office: [phone]  /  Fax: [phone]  Weight Summary and Body Composition Analysis (BIA)  Vitals Temp: 97.7 F (36.5 C) BP: (!) 131/90 Pulse Rate: (!) 58 SpO2: 98 %   Anthropometric Measurements Height: 5' 9 (1.753 m) Weight: 235 lb (106.6 kg) BMI (Calculated): 34.69 Weight at Last Visit: 230 lb Weight Lost Since Last Visit: 0 Weight Gained Since Last Visit: 5 lb Starting Weight: 230 lb Total Weight Loss (lbs): 0 lb (0 kg) Peak Weight: 270 lb   Body Composition  Body Fat %: 45 % Fat Mass (lbs): 105.8 lbs Muscle Mass (lbs): 122.6 lbs Total Body Water (lbs): 88.2 lbs Visceral Fat Rating : 12   The 10-year ASCVD risk score (Arnett DK, et al., 2019) is: 7.2%  RMR: 1613  Today's Visit #: 2  Starting Date: 03/23/24   Subjective   Chief Complaint: Obesity  Interval History Discussed the use of AI scribe software for clinical note transcription with the patient, who gave verbal consent to proceed.  History of Present Illness Bonnie Adams is a 53 year old female who presents for a post intake appointment for medical weight management.  She is facing challenges adhering to an 1100 calorie diet plan due to her living situation. The meals prepared in the household are often high in carbohydrates, catering to the preferences of her grandchildren and son-in-law. She has discussed with her daughter about incorporating more salads into her diet, which she enjoys and plans to utilize more frequently.  She experiences physical limitations that hinder her ability to exercise, including knee pain. She has been undergoing physical therapy but finds it not very beneficial. Despite these challenges, she has started incorporating small changes such as making protein smoothies to break her fast and freezing them as a substitute for ice cream.  She has a history of elevated cholesterol, previously managed with lovastatin, which she discontinued due to concerns about  its impact on her kidney function. She reports a family history of high cholesterol, which she believes is exacerbated by her current diet. Her recent blood work shows a total cholesterol level of 187 mg/dL.  She has a history of elevated alkaline phosphatase levels, which have been noted in previous blood tests. She no longer has a gallbladder, and her vitamin D levels are normal. Her kidney function has shown improvement, with a current GFR of 60, up from 59 previously. She avoids medications like Aleve and ibuprofen to protect her kidney function.  She reports skipping meals, which may be affecting her metabolism. Her physical activity levels have decreased due to her medical conditions, contributing to her weight management challenges. No excessive hunger or overeating.     Challenges affecting patient progress: lack of partner or family support, having difficulty with meal prep and planning, low volume of physical activity at present , orthopedic problems, medical conditions or chronic pain affecting mobility, and medical comorbidities.    Pharmacotherapy for weight management: She is currently taking no anti-obesity medication.   Assessment and Plan   Treatment Plan For Obesity:  Recommended Dietary Goals  Bonnie Adams is currently in the action stage of change. As such, her goal is to continue weight management plan. She has agreed to: incorporate prepackaged healthy meals for convenience, incorporate 1-2 meal replacements a day for convenience , continue current plan, and continue to work on implementation of reduced calorie nutrition plan (RCNP)  Behavioral Health and Counseling  We discussed the following behavioral modification strategies today: continue to work on maintaining  a reduced calorie state, getting the recommended amount of protein, incorporating whole foods, making healthy choices, staying well hydrated and practicing mindfulness when eating. and increase protein intake,  fibrous foods (25 grams per day for women, 30 grams for men) and water to improve satiety and decrease hunger signals. .  Additional education and resources provided today: Benefits of exercise influence in body function  Recommended Physical Activity Goals  Bonnie Adams has been advised to work up to 150 minutes of moderate intensity aerobic activity a week and strengthening exercises 2-3 times per week for cardiovascular health, weight loss maintenance and preservation of muscle mass.  She has agreed to :  Think about enjoyable ways to increase daily physical activity and overcoming barriers to exercise and Increase physical activity in their day and reduce sedentary time (increase NEAT).  Medical Interventions and Pharmacotherapy  We discussed various medication options to help Bonnie Adams with her weight loss efforts and we both agreed to : Continue with current nutritional and behavioral strategies  Associated Conditions Impacted by Obesity Treatment  Assessment & Plan Class 1 obesity with serious comorbidity and body mass index (BMI) of 34.0 to 34.9 in adult, unspecified obesity type Reviewed all labs completed during intake appointment.  Aside from mild elevation in insulin she does not have significant problems with glucose metabolism. Obesity management is challenging due to environmental factors and physical limitations. Current metabolic rate is 8399 calories, with a target of 1100 calories per day. Difficulty adhering to dietary recommendations due to living situation and lack of kitchen resources. Physical ailments hinder exercise capacity. - Adhere to a target of 1100 calories per day. - Focus on meal frequency and protein intake to boost metabolism. - Incorporate small, manageable physical activities, such as walking for 5-10 minutes, three times a day. - Consider intermittent fasting with an 8-hour eating window and three meals within that period. - Use protein smoothies as meal  replacements and ensure they are within a 300-calorie limit. - Utilize a balanced plate method for meal planning, focusing on protein and vegetables. Pure hypercholesterolemia Cholesterol level is very high at 187, likely due to genetic factors. Previous use of lovastatin was discontinued due to concerns about kidney function. High cholesterol increases cardiovascular risk to 7.2%, which is moderate risk for heart attack and stroke. - Reinitiate cholesterol medication to manage high cholesterol levels and reduce cardiovascular risk. Chronic kidney disease, stage 3a (HCC) Her GFR was at 60 which has improved.  She is on spironolactone  which may effect GFR especially with inadequate hydration.  She is to work on increasing fluid avoiding nephrotoxins. Elevated alkaline phosphatase level I reviewed labs from several years she has had persistent elevation of her alkaline phosphatase.  She had a history of gallbladder surgery she is not aware of previous workups.  She will discuss this with primary care.  She would benefit from further testing to determine if from the liver or bone.  If liver in origin she may benefit from imaging of her liver.         Objective   Physical Exam:  Blood pressure (!) 131/90, pulse (!) 58, temperature 97.7 F (36.5 C), height 5' 9 (1.753 m), weight 235 lb (106.6 kg), SpO2 98%. Body mass index is 34.7 kg/m.  General: She is overweight, cooperative, alert, well developed, and in no acute distress. PSYCH: Has normal mood, affect and thought process.   HEENT: EOMI, sclerae are anicteric. Lungs: Normal breathing effort, no conversational dyspnea. Extremities: No edema.  Neurologic: No  gross sensory or motor deficits. No tremors or fasciculations noted.    Diagnostic Data Reviewed:  BMET    Component Value Date/Time   NA 139 03/23/2024 1041   NA 135 (L) 01/02/2014 1634   K 4.3 03/23/2024 1041   K 3.9 01/02/2014 1634   CL 102 03/23/2024 1041   CL 102  01/02/2014 1634   CO2 18 (L) 03/23/2024 1041   CO2 28 01/02/2014 1634   GLUCOSE 87 03/23/2024 1041   GLUCOSE 140 (H) 06/07/2021 0825   GLUCOSE 86 01/02/2014 1634   BUN 15 03/23/2024 1041   BUN 10 01/02/2014 1634   CREATININE 1.09 (H) 03/23/2024 1041   CREATININE 0.91 01/02/2014 1634   CALCIUM 9.9 03/23/2024 1041   CALCIUM 8.8 01/02/2014 1634   GFRNONAA 59 (L) 06/07/2021 0825   GFRNONAA >60 01/02/2014 1634   GFRAA >60 03/27/2018 0736   GFRAA >60 01/02/2014 1634   Lab Results  Component Value Date   HGBA1C 5.4 03/23/2024   Lab Results  Component Value Date   INSULIN 12.1 03/23/2024   No results found for: TSH CBC    Component Value Date/Time   WBC 5.7 03/23/2024 1041   WBC 5.7 06/07/2021 0825   RBC 4.35 03/23/2024 1041   RBC 4.12 06/07/2021 0825   HGB 13.9 03/23/2024 1041   HCT 41.7 03/23/2024 1041   PLT 195 03/23/2024 1041   MCV 96 03/23/2024 1041   MCV 100 01/02/2014 1634   MCH 32.0 03/23/2024 1041   MCH 31.6 06/07/2021 0825   MCHC 33.3 03/23/2024 1041   MCHC 34.0 06/07/2021 0825   RDW 12.6 03/23/2024 1041   RDW 14.5 01/02/2014 1634   Iron Studies No results found for: IRON, TIBC, FERRITIN, IRONPCTSAT Lipid Panel     Component Value Date/Time   CHOL 267 (H) 03/23/2024 1041   TRIG 104 03/23/2024 1041   HDL 52 03/23/2024 1041   LDLCALC 197 (H) 03/23/2024 1041   Hepatic Function Panel     Component Value Date/Time   PROT 7.2 03/23/2024 1041   PROT 7.2 01/02/2014 1634   ALBUMIN 4.5 03/23/2024 1041   ALBUMIN 3.2 (L) 01/02/2014 1634   AST 20 03/23/2024 1041   AST 26 01/02/2014 1634   ALT 15 03/23/2024 1041   ALT 27 01/02/2014 1634   ALKPHOS 146 (H) 03/23/2024 1041   ALKPHOS 104 01/02/2014 1634   BILITOT 0.4 03/23/2024 1041   BILITOT 0.4 01/02/2014 1634   No results found for: TSH Nutritional No results found for: VD25OH  Medications: Outpatient Encounter Medications as of 04/06/2024  Medication Sig   apixaban  (ELIQUIS ) 5 MG TABS  tablet Take 5 mg by mouth 2 (two) times daily.   ascorbic acid (VITAMIN C) 1000 MG tablet Take 1,000 mg by mouth.   clidinium-chlordiazePOXIDE (LIBRAX) 5-2.5 MG capsule Daily each morning and up to one additional capsule if needed later in the day for cramping around bowel movements   Coenzyme Q10 10 MG capsule Take by mouth.   DULoxetine  (CYMBALTA ) 60 MG capsule Take 60 mg by mouth daily.   Probiotic Product (RISAQUAD-2) CAPS Take 1 capsule by mouth every morning.   spironolactone  (ALDACTONE ) 25 MG tablet Take 1 tablet by mouth daily.   SUMAtriptan (IMITREX) 100 MG tablet Take by mouth.   traZODone (DESYREL) 50 MG tablet Take by mouth.   [DISCONTINUED] pimecrolimus  (ELIDEL ) 1 % cream Apply topically 2 (two) times daily as needed. For rash (Patient not taking: Reported on 04/06/2024)   [DISCONTINUED] tacrolimus  (PROTOPIC )  0.1 % ointment Apply to affected area daily. (Patient not taking: Reported on 04/06/2024)   [DISCONTINUED] triamcinolone  cream (KENALOG ) 0.1 % APPLY TWICE DAILY AS NEEDED TO AFFECTED AREAS FOR TWO WEEKS THEN APPLY TWICE DAILY ON WEEKENDS ONLY. (Patient not taking: Reported on 04/06/2024)   No facility-administered encounter medications on file as of 04/06/2024.     Follow-Up   No follow-ups on file.SABRA She was informed of the importance of frequent follow up visits to maximize her success with intensive lifestyle modifications for her multiple health conditions.  Attestation Statement   Reviewed by clinician on day of visit: allergies, medications, problem list, medical history, surgical history, family history, social history, and previous encounter notes.   I have spent 43 minutes in the care of the patient today including: 2 minutes before the visit reviewing and preparing the chart. 35 minutes face-to-face assessing and reviewing listed medical problems as outlined in obesity care plan, providing nutritional and behavioral counseling on topics outlined in the obesity  care plan, independently interpreting test results and goals of care, as described in assessment and plan, reviewing and discussing biometric information and progress, and reviewing external labs with patient 6 minutes after the visit updating chart and documentation of encounter.    Lucas Parker, MD

## 2024-04-26 ENCOUNTER — Ambulatory Visit (INDEPENDENT_AMBULATORY_CARE_PROVIDER_SITE_OTHER): Payer: Self-pay | Admitting: Internal Medicine
